# Patient Record
Sex: Male | Born: 1975 | Race: White | Hispanic: No | Marital: Single | Smoking: Never smoker
Health system: Southern US, Community
[De-identification: ages and names within clinical notes are randomized; demographics above are authoritative.]

## PROBLEM LIST (undated history)

## (undated) DIAGNOSIS — H269 Unspecified cataract: Secondary | ICD-10-CM

## (undated) DIAGNOSIS — I10 Essential (primary) hypertension: Secondary | ICD-10-CM

## (undated) DIAGNOSIS — E785 Hyperlipidemia, unspecified: Secondary | ICD-10-CM

## (undated) DIAGNOSIS — E119 Type 2 diabetes mellitus without complications: Secondary | ICD-10-CM

## (undated) HISTORY — DX: Hyperlipidemia, unspecified: E78.5

## (undated) HISTORY — DX: Type 2 diabetes mellitus without complications: E11.9

## (undated) HISTORY — DX: Essential (primary) hypertension: I10

## (undated) HISTORY — PX: ANTERIOR CRUCIATE LIGAMENT REPAIR: SHX115

## (undated) HISTORY — DX: Unspecified cataract: H26.9

## (undated) HISTORY — PX: TONSILLECTOMY: SUR1361

## (undated) HISTORY — PX: KIDNEY STONE SURGERY: SHX686

---

## 2019-12-08 ENCOUNTER — Other Ambulatory Visit: Payer: Self-pay

## 2019-12-09 ENCOUNTER — Encounter: Payer: Self-pay | Admitting: Family Medicine

## 2019-12-09 ENCOUNTER — Ambulatory Visit (INDEPENDENT_AMBULATORY_CARE_PROVIDER_SITE_OTHER): Payer: Self-pay | Admitting: Family Medicine

## 2019-12-09 VITALS — BP 132/80 | HR 78 | Temp 96.1°F | Resp 12 | Ht 66.0 in | Wt 228.0 lb

## 2019-12-09 DIAGNOSIS — I10 Essential (primary) hypertension: Secondary | ICD-10-CM | POA: Insufficient documentation

## 2019-12-09 DIAGNOSIS — Z6836 Body mass index (BMI) 36.0-36.9, adult: Secondary | ICD-10-CM

## 2019-12-09 DIAGNOSIS — E785 Hyperlipidemia, unspecified: Secondary | ICD-10-CM

## 2019-12-09 DIAGNOSIS — E1165 Type 2 diabetes mellitus with hyperglycemia: Secondary | ICD-10-CM | POA: Insufficient documentation

## 2019-12-09 DIAGNOSIS — E66812 Obesity, class 2: Secondary | ICD-10-CM | POA: Insufficient documentation

## 2019-12-09 DIAGNOSIS — E669 Obesity, unspecified: Secondary | ICD-10-CM | POA: Insufficient documentation

## 2019-12-09 DIAGNOSIS — Z794 Long term (current) use of insulin: Secondary | ICD-10-CM

## 2019-12-09 DIAGNOSIS — E1169 Type 2 diabetes mellitus with other specified complication: Secondary | ICD-10-CM

## 2019-12-09 MED ORDER — ROSUVASTATIN CALCIUM 10 MG PO TABS: 10.0000 mg | ORAL_TABLET | Freq: Every day | ORAL | 1 refills | Status: DC

## 2019-12-09 MED ORDER — EMPAGLIFLOZIN-METFORMIN HCL 5-1000 MG PO TABS: 1.0000 | ORAL_TABLET | Freq: Two times a day (BID) | ORAL | 2 refills | Status: DC

## 2019-12-09 MED ORDER — VALSARTAN 80 MG PO TABS: 80.0000 mg | ORAL_TABLET | Freq: Every day | ORAL | 1 refills | Status: DC

## 2019-12-09 MED ORDER — INSULIN SYRINGE-NEEDLE U-100 30G 1 ML MISC: 3 refills | Status: DC

## 2019-12-09 MED ORDER — INSULIN GLARGINE 100 UNIT/ML ~~LOC~~ SOLN: 15.0000 [IU] | Freq: Every day | SUBCUTANEOUS | 3 refills | Status: DC

## 2019-12-09 NOTE — Assessment & Plan Note (Addendum)
HgA1C is far from goal. Metformin dose increased from 500 mg to 2000 mg and Jardiance 10 mg added. Lantus increased from 10 units to 15 units. Diabetes education will be arranged. We discussed possible complications of diabetes. Strongly recommend arranging eye exam.  Regular exercise and healthy diet with avoidance of added sugar food intake is an important part of treatment and recommended. Annual eye exam, periodic dental and foot care recommended. F/U in 4 months

## 2019-12-09 NOTE — Progress Notes (Signed)
HPI:   Austin Fuller is a 44 y.o. male, who is here today to establish care.  Former PCP: He just moved from [place] to the area a month ago. Last preventive routine visit: Not sure but has blood work in 08/2019  Chronic medical problems: DM 2, hypertension, hyperlipidemia. He is not aware of renal disease and no history of CAD.  DM2 diagnosed in 2017. He has been out of Metformin for a couple weeks. + polyuria and polydipsia. He has not been checking BS regularly. Last eye exam in 2019. Does not remember her last A1c result. He has not noted numbness, tingling, or burning in feet but has had some occasional tingling-like sensation in left hand that lasts a few minutes. He is right-handed.  Hypertension: Diagnosed in 2017. Currently he is on valsartan 80 mg daily. He is not checking BP regularly. Negative for severe/frequent headache,chest pain, dyspnea, palpitation, claudication, focal weakness, or edema.  He had vision screening recently as part of pre employment exam and noted blurry vision.  Hyperlipidemia: Currently he is on Crestor, not sure about dose. He has tolerated medication well.  He acknowledges that he has not been consistent with following dietary recommendations. He is not exercising regularly.   Review of Systems  Constitutional: Negative for activity change, appetite change, fatigue and fever.  HENT: Negative for mouth sores, nosebleeds and sore throat.   Respiratory: Negative for cough and wheezing.   Gastrointestinal: Negative for abdominal pain, nausea and vomiting.  Genitourinary: Negative for decreased urine volume, dysuria and hematuria.  Musculoskeletal: Negative for gait problem and myalgias.  Skin: Negative for rash and wound.  Neurological: Negative for syncope, facial asymmetry and weakness.  Rest see pertinent positives and negatives per HPI.  No current outpatient medications on file prior to visit.   No current  facility-administered medications on file prior to visit.   Past Medical History:  Diagnosis Date  . Diabetes mellitus without complication (HCC)   . Hyperlipidemia   . Hypertension    No Known Allergies  Family History  Problem Relation Age of Onset  . Cancer Mother   . Early death Mother   . Early death Father   . Heart attack Father   . Heart attack Sister    Social History   Socioeconomic History  . Marital status: Single    Spouse name: Not on file  . Number of children: Not on file  . Years of education: Not on file  . Highest education level: Not on file  Occupational History  . Not on file  Tobacco Use  . Smoking status: Never Smoker  . Smokeless tobacco: Never Used  Substance and Sexual Activity  . Alcohol use: Not Currently  . Drug use: Never  . Sexual activity: Not Currently  Other Topics Concern  . Not on file  Social History Narrative  . Not on file   Social Determinants of Health   Financial Resource Strain:   . Difficulty of Paying Living Expenses: Not on file  Food Insecurity:   . Worried About Programme researcher, broadcasting/film/video in the Last Year: Not on file  . Ran Out of Food in the Last Year: Not on file  Transportation Needs:   . Lack of Transportation (Medical): Not on file  . Lack of Transportation (Non-Medical): Not on file  Physical Activity:   . Days of Exercise per Week: Not on file  . Minutes of Exercise per Session: Not on file  Stress:   .  Feeling of Stress : Not on file  Social Connections:   . Frequency of Communication with Friends and Family: Not on file  . Frequency of Social Gatherings with Friends and Family: Not on file  . Attends Religious Services: Not on file  . Active Member of Clubs or Organizations: Not on file  . Attends Banker Meetings: Not on file  . Marital Status: Not on file    Vitals:   12/09/19 1106  BP: 132/80  Pulse: 78  Resp: 12  Temp: (!) 96.1 F (35.6 C)  SpO2: 97%   Body mass index is 36.8  kg/m.  Physical Exam  Nursing note and vitals reviewed. Constitutional: He is oriented to person, place, and time. He appears well-developed. No distress.  HENT:  Head: Normocephalic and atraumatic.  Mouth/Throat: Oropharynx is clear and moist and mucous membranes are normal.  Eyes: Pupils are equal, round, and reactive to light. Conjunctivae are normal.  Cardiovascular: Normal rate and regular rhythm.  No murmur heard. Pulses:      Dorsalis pedis pulses are 2+ on the right side and 2+ on the left side.  Respiratory: Effort normal and breath sounds normal. No respiratory distress.  GI: Soft. He exhibits no mass. There is no hepatomegaly. There is no abdominal tenderness.  Musculoskeletal:        General: No edema.  Lymphadenopathy:    He has no cervical adenopathy.  Neurological: He is alert and oriented to person, place, and time. He has normal strength. No cranial nerve deficit. Gait normal.  Skin: Skin is warm. No rash noted. No erythema.  Psychiatric: He has a normal mood and affect. Cognition and memory are normal.  Well groomed, good eye contact.   Diabetic Foot Exam - Simple   Simple Foot Form Diabetic Foot exam was performed with the following findings: Yes 12/09/2019 11:48 AM  Visual Inspection No deformities, no ulcerations, no other skin breakdown bilaterally: Yes Sensation Testing Intact to touch and monofilament testing bilaterally: Yes Pulse Check Posterior Tibialis and Dorsalis pulse intact bilaterally: Yes Comments     ASSESSMENT AND PLAN:  Austin Fuller was seen today for establish care.  Diagnoses and all orders for this visit:  Orders Placed This Encounter  Procedures  . Comprehensive metabolic panel  . Microalbumin / creatinine urine ratio  . Fructosamine  . Hemoglobin A1c  . Lipid panel  . CBC  . Amb Referral to Nutrition and Diabetic E    Hyperlipidemia associated with type 2 diabetes mellitus (HCC) He is not sure about Crestor dose, recommend  10 mg daily. We will adjust dose after lipid panel results. Low-fat diet is also recommended.  Type 2 diabetes mellitus with other specified complication (HCC) HgA1C is far from goal. Metformin dose increased from 500 mg to 2000 mg and Jardiance 10 mg added. Lantus increased from 10 units to 15 units. Diabetes education will be arranged. We discussed possible complications of diabetes. Strongly recommend arranging eye exam.  Regular exercise and healthy diet with avoidance of added sugar food intake is an important part of treatment and recommended. Annual eye exam, periodic dental and foot care recommended. F/U in 4 months   Hypertension, essential, benign BP adequately controlled. Continue valsartan 80 mg daily. Recommend monitoring BP regularly. Continue low-salt diet.  Class 2 obesity with body mass index (BMI) of 36.0 to 36.9 in adult We discussed benefits of wt loss as well as adverse effects of obesity. Consistency with healthy diet and physical activity recommended.  Daily brisk walking for 15-30 min as tolerated.   He will have health insurance after 12/15/19, so further lab orders placed.  Return in about 4 months (around 04/07/2020) for Labs on 12/16/19.Marland Kitchen   Giordano Getman G. [place], MD  [facility]. [place] office.

## 2019-12-09 NOTE — Assessment & Plan Note (Signed)
We discussed benefits of wt loss as well as adverse effects of obesity. Consistency with healthy diet and physical activity recommended. Daily brisk walking for 15-30 min as tolerated.  

## 2019-12-09 NOTE — Assessment & Plan Note (Signed)
He is not sure about Crestor dose, recommend 10 mg daily. We will adjust dose after lipid panel results. Low-fat diet is also recommended.

## 2019-12-09 NOTE — Patient Instructions (Signed)
A few things to remember from today's visit:   Hypertension, essential, benign - Plan: CBC  Type 2 diabetes mellitus with other specified complication, with long-term current use of insulin (HCC) - Plan: Comprehensive metabolic panel, Microalbumin / creatinine urine ratio, Fructosamine, Hemoglobin A1c  Hyperlipidemia associated with type 2 diabetes mellitus (HCC) - Plan: Lipid panel, rosuvastatin (CRESTOR) 10 MG tablet   Diabetes Mellitus and Nutrition, Adult When you have diabetes (diabetes mellitus), it is very important to have healthy eating habits because your blood sugar (glucose) levels are greatly affected by what you eat and drink. Eating healthy foods in the appropriate amounts, at about the same times every day, can help you:  Control your blood glucose.  Lower your risk of heart disease.  Improve your blood pressure.  Reach or maintain a healthy weight. Every person with diabetes is different, and each person has different needs for a meal plan. Your health care provider may recommend that you work with a diet and nutrition specialist (dietitian) to make a meal plan that is best for you. Your meal plan may vary depending on factors such as:  The calories you need.  The medicines you take.  Your weight.  Your blood glucose, blood pressure, and cholesterol levels.  Your activity level.  Other health conditions you have, such as heart or kidney disease. How do carbohydrates affect me? Carbohydrates, also called carbs, affect your blood glucose level more than any other type of food. Eating carbs naturally raises the amount of glucose in your blood. Carb counting is a method for keeping track of how many carbs you eat. Counting carbs is important to keep your blood glucose at a healthy level, especially if you use insulin or take certain oral diabetes medicines. It is important to know how many carbs you can safely have in each meal. This is different for every person. Your  dietitian can help you calculate how many carbs you should have at each meal and for each snack. Foods that contain carbs include:  Bread, cereal, rice, pasta, and crackers.  Potatoes and corn.  Peas, beans, and lentils.  Milk and yogurt.  Fruit and juice.  Desserts, such as cakes, cookies, ice cream, and candy. How does alcohol affect me? Alcohol can cause a sudden decrease in blood glucose (hypoglycemia), especially if you use insulin or take certain oral diabetes medicines. Hypoglycemia can be a life-threatening condition. Symptoms of hypoglycemia (sleepiness, dizziness, and confusion) are similar to symptoms of having too much alcohol. If your health care provider says that alcohol is safe for you, follow these guidelines:  Limit alcohol intake to no more than 1 drink per day for nonpregnant women and 2 drinks per day for men. One drink equals 12 oz of beer, 5 oz of wine, or 1 oz of hard liquor.  Do not drink on an empty stomach.  Keep yourself hydrated with water, diet soda, or unsweetened iced tea.  Keep in mind that regular soda, juice, and other mixers may contain a lot of sugar and must be counted as carbs. What are tips for following this plan?  Reading food labels  Start by checking the serving size on the "Nutrition Facts" label of packaged foods and drinks. The amount of calories, carbs, fats, and other nutrients listed on the label is based on one serving of the item. Many items contain more than one serving per package.  Check the total grams (g) of carbs in one serving. You can calculate the number  of servings of carbs in one serving by dividing the total carbs by 15. For example, if a food has 30 g of total carbs, it would be equal to 2 servings of carbs.  Check the number of grams (g) of saturated and trans fats in one serving. Choose foods that have low or no amount of these fats.  Check the number of milligrams (mg) of salt (sodium) in one serving. Most people  should limit total sodium intake to less than 2,300 mg per day.  Always check the nutrition information of foods labeled as "low-fat" or "nonfat". These foods may be higher in added sugar or refined carbs and should be avoided.  Talk to your dietitian to identify your daily goals for nutrients listed on the label. Shopping  Avoid buying canned, premade, or processed foods. These foods tend to be high in fat, sodium, and added sugar.  Shop around the outside edge of the grocery store. This includes fresh fruits and vegetables, bulk grains, fresh meats, and fresh dairy. Cooking  Use low-heat cooking methods, such as baking, instead of high-heat cooking methods like deep frying.  Cook using healthy oils, such as olive, canola, or sunflower oil.  Avoid cooking with butter, cream, or high-fat meats. Meal planning  Eat meals and snacks regularly, preferably at the same times every day. Avoid going long periods of time without eating.  Eat foods high in fiber, such as fresh fruits, vegetables, beans, and whole grains. Talk to your dietitian about how many servings of carbs you can eat at each meal.  Eat 4-6 ounces (oz) of lean protein each day, such as lean meat, chicken, fish, eggs, or tofu. One oz of lean protein is equal to: ? 1 oz of meat, chicken, or fish. ? 1 egg. ?  cup of tofu.  Eat some foods each day that contain healthy fats, such as avocado, nuts, seeds, and fish. Lifestyle  Check your blood glucose regularly.  Exercise regularly as told by your health care provider. This may include: ? 150 minutes of moderate-intensity or vigorous-intensity exercise each week. This could be brisk walking, biking, or water aerobics. ? Stretching and doing strength exercises, such as yoga or weightlifting, at least 2 times a week.  Take medicines as told by your health care provider.  Do not use any products that contain nicotine or tobacco, such as cigarettes and e-cigarettes. If you need  help quitting, ask your health care provider.  Work with a Social worker or diabetes educator to identify strategies to manage stress and any emotional and social challenges. Questions to ask a health care provider  Do I need to meet with a diabetes educator?  Do I need to meet with a dietitian?  What number can I call if I have questions?  When are the best times to check my blood glucose? Where to find more information:  American Diabetes Association: diabetes.org  Academy of Nutrition and Dietetics: www.eatright.[employer] of Diabetes and Digestive and Kidney Diseases (NIH): [employer] Summary  A healthy meal plan will help you control your blood glucose and maintain a healthy lifestyle.  Working with a diet and nutrition specialist (dietitian) can help you make a meal plan that is best for you.  Keep in mind that carbohydrates (carbs) and alcohol have immediate effects on your blood glucose levels. It is important to count carbs and to use alcohol carefully. This information is not intended to replace advice given to you by your health care provider.  Make sure you discuss any questions you have with your health care provider. Document Revised: 09/14/2017 Document Reviewed: 11/06/2016 Elsevier Patient Education  2020 [employer]. Please be sure medication list is accurate. If a new problem present, please set up appointment sooner than planned today.

## 2019-12-09 NOTE — Assessment & Plan Note (Signed)
BP adequately controlled. Continue valsartan 80 mg daily. Recommend monitoring BP regularly. Continue low-salt diet.

## 2019-12-19 ENCOUNTER — Other Ambulatory Visit: Payer: Self-pay

## 2019-12-22 ENCOUNTER — Other Ambulatory Visit (INDEPENDENT_AMBULATORY_CARE_PROVIDER_SITE_OTHER): Payer: 59

## 2019-12-22 ENCOUNTER — Ambulatory Visit: Payer: Self-pay

## 2019-12-22 ENCOUNTER — Other Ambulatory Visit: Payer: Self-pay

## 2019-12-22 DIAGNOSIS — I10 Essential (primary) hypertension: Secondary | ICD-10-CM

## 2019-12-22 DIAGNOSIS — E1169 Type 2 diabetes mellitus with other specified complication: Secondary | ICD-10-CM | POA: Diagnosis not present

## 2019-12-22 DIAGNOSIS — Z794 Long term (current) use of insulin: Secondary | ICD-10-CM

## 2019-12-22 DIAGNOSIS — E785 Hyperlipidemia, unspecified: Secondary | ICD-10-CM

## 2019-12-22 LAB — COMPREHENSIVE METABOLIC PANEL
ALT: 14 U/L (ref 0–53)
AST: 10 U/L (ref 0–37)
Albumin: 4.3 g/dL (ref 3.5–5.2)
Alkaline Phosphatase: 146 U/L — ABNORMAL HIGH (ref 39–117)
BUN: 11 mg/dL (ref 6–23)
CO2: 32 mEq/L (ref 19–32)
Calcium: 10 mg/dL (ref 8.4–10.5)
Chloride: 100 mEq/L (ref 96–112)
Creatinine, Ser: 1.02 mg/dL (ref 0.40–1.50)
GFR: 79.45 mL/min (ref >60.00)
Glucose, Bld: 460 mg/dL — ABNORMAL HIGH (ref 70–99)
Potassium: 4.3 mEq/L (ref 3.5–5.1)
Sodium: 138 mEq/L (ref 135–145)
Total Bilirubin: 0.6 mg/dL (ref 0.2–1.2)
Total Protein: 6.6 g/dL (ref 6.0–8.3)

## 2019-12-22 LAB — LIPID PANEL
Cholesterol: 150 mg/dL (ref 0–200)
HDL: 36.3 mg/dL — ABNORMAL LOW (ref >39.00)
NonHDL: 113.85
Total CHOL/HDL Ratio: 4
Triglycerides: 242 mg/dL — ABNORMAL HIGH (ref 0.0–149.0)
VLDL: 48.4 mg/dL — ABNORMAL HIGH (ref 0.0–40.0)

## 2019-12-22 LAB — MICROALBUMIN / CREATININE URINE RATIO
Creatinine,U: 49.5 mg/dL
Microalb Creat Ratio: 1.7 mg/g (ref 0.0–30.0)
Microalb, Ur: 0.8 mg/dL (ref 0.0–1.9)

## 2019-12-22 LAB — CBC
HCT: 46.9 % (ref 39.0–52.0)
Hemoglobin: 16.1 g/dL (ref 13.0–17.0)
MCHC: 34.3 g/dL (ref 30.0–36.0)
MCV: 95 fl (ref 78.0–100.0)
Platelets: 217 10*3/uL (ref 150.0–400.0)
RBC: 4.93 Mil/uL (ref 4.22–5.81)
RDW: 12 % (ref 11.5–15.5)
WBC: 5.5 10*3/uL (ref 4.0–10.5)

## 2019-12-22 LAB — LDL CHOLESTEROL, DIRECT: Direct LDL: 78 mg/dL

## 2019-12-22 LAB — HEMOGLOBIN A1C: Hgb A1c MFr Bld: 15.9 % — ABNORMAL HIGH (ref 4.6–6.5)

## 2019-12-25 LAB — FRUCTOSAMINE: Fructosamine: >600 umol/L — ABNORMAL HIGH (ref 205–285)

## 2020-01-06 ENCOUNTER — Encounter: Payer: 59 | Attending: Family Medicine | Admitting: Registered"

## 2020-01-19 ENCOUNTER — Telehealth: Payer: Self-pay | Admitting: Family Medicine

## 2020-01-19 NOTE — Telephone Encounter (Signed)
Pt is requesting a refill on Valsartan 80 mg tablet and Metformin HCI 02-999 mg tabs sent to [facility] on [address]. Also, pt would like a call back to discuss his lab results from a month ago. Thanks

## 2020-01-20 ENCOUNTER — Other Ambulatory Visit: Payer: Self-pay | Admitting: *Deleted

## 2020-01-20 DIAGNOSIS — I10 Essential (primary) hypertension: Secondary | ICD-10-CM

## 2020-01-20 DIAGNOSIS — E1169 Type 2 diabetes mellitus with other specified complication: Secondary | ICD-10-CM

## 2020-01-20 DIAGNOSIS — Z794 Long term (current) use of insulin: Secondary | ICD-10-CM

## 2020-01-20 MED ORDER — VALSARTAN 80 MG PO TABS: 80.0000 mg | ORAL_TABLET | Freq: Every day | ORAL | 1 refills | Status: DC

## 2020-01-20 MED ORDER — EMPAGLIFLOZIN-METFORMIN HCL 5-1000 MG PO TABS: 1.0000 | ORAL_TABLET | Freq: Two times a day (BID) | ORAL | 2 refills | Status: DC

## 2020-01-20 NOTE — Telephone Encounter (Signed)
Please advise on lab results. Note from 12/30/19 states Treatment was adjusted last visit. Rx's sent to pharmacy as requested.

## 2020-01-23 NOTE — Telephone Encounter (Signed)
Diabetes numbers are not at goal. Cholesterol mildly elevated.  Because last visit Crestor 10 mg was resumed and diabetes treatment was adjusted, I did not make new recommendations at this time. If BS's 200's, we will need to adjust Lantus dose, increase by 5 U.  Nutrition referral can also be arranged [if he has not done so before and is interested in doing so.] Labs need to be repeated on 04/07/2020, f/u appt. Thanks, BJ

## 2020-01-23 NOTE — Telephone Encounter (Signed)
Note read to patient. Patient verbalized understanding. Patient stated that he has an upcoming appointment with the nutritionist.

## 2020-02-09 ENCOUNTER — Encounter: Payer: 59 | Attending: Family Medicine | Admitting: Dietician

## 2020-02-09 ENCOUNTER — Encounter: Payer: Self-pay | Admitting: Dietician

## 2020-02-09 ENCOUNTER — Other Ambulatory Visit: Payer: Self-pay

## 2020-02-09 DIAGNOSIS — E1169 Type 2 diabetes mellitus with other specified complication: Secondary | ICD-10-CM | POA: Diagnosis not present

## 2020-02-09 DIAGNOSIS — Z794 Long term (current) use of insulin: Secondary | ICD-10-CM | POA: Insufficient documentation

## 2020-02-09 DIAGNOSIS — Z713 Dietary counseling and surveillance: Secondary | ICD-10-CM | POA: Insufficient documentation

## 2020-02-09 NOTE — Patient Instructions (Addendum)
Consider the [employer] 14 day.  You do not need the reader as you can use an app on your cell phone.  Ask your doctor to prescribe this if desired.  Calorie [employer] Consider reading labels and becoming informed about your meal choices especially when eating out.   Aim for 4 Carb Choices per meal (60 grams) +/- 1 either way  Aim for 0-2 Carbs per snack if hungry  Include protein in moderation with your meals and snacks Consider reading food labels for Total Carbohydrate of foods Consider  increasing your activity level by walking for 30 minutes daily as tolerated Consider checking BG at alternate times per day  Consider taking medication as directed by MD

## 2020-02-09 NOTE — Progress Notes (Signed)
Diabetes Self-Management Education  Visit Type: First/Initial  Appt. Start Time: 1910 Appt. End Time: 1020  02/09/2020  Mr. Austin Fuller, identified by name and date of birth, is a 44 y.o. male with a diagnosis of Diabetes: Type 2.   ASSESSMENT Patient is here today alone.  History includes Type 2 diabetes diagnosed in 2017, HTN, and HLD. Last A1C 15.9% 12/22/2019.  Other labs include Cholesterol 156, HDL 36, LDL 78, Triglycerides 242, GFR 79 12/2019 Medications include:  Metformin, Lantus 15 units at night (Jardiance added but he is not taking this). Sleep:  6-8 hours, snores He is not currently checking his blood sugar.  Discussed the importance of this for medical management as well as safety.  He is receptive to the [employer].    Weight hx: 239 lbs 02/09/2020, lowest adult weight 228 lbs 12/09/2019 300 lbs 1 year ago.  Lost due to uncontrolled DM.  Patient lives alone.  Eats out or TV dinners.  Does not know how to cook.  Verbalized the need to eat healthier.  Inconsistent meal schedule due to shift work.  Patient's MD in [place] suggested the keto diet.  Discussed the risks and rapid weight regain with this way of eating.  He lost his father last year and is the executor for his estate.  He moved her from [place] in January 2021. He works nights in Human resources officer.  He is currently living in an extended stay hotel with a kitchenette and is currently looking to buy a home.   Height 5\' 7"  (1.702 m), weight 239 lb (108.4 kg). Body mass index is 37.43 kg/m.  Diabetes Self-Management Education - 02/09/20 0923      Visit Information   Visit Type  First/Initial      Initial Visit   Diabetes Type  Type 2    Are you taking your medications as prescribed?  Yes    Date Diagnosed  2017      Health Coping   How would you rate your overall health?  Good      Psychosocial Assessment   Patient Belief/Attitude about Diabetes  Motivated to manage diabetes    Self-care barriers   None    Self-management support  Doctor's office    Patient Concerns  Nutrition/Meal planning;Glycemic Control;Weight Control    Special Needs  None    Preferred Learning Style  No preference indicated    Learning Readiness  Ready    How often do you need to have someone help you when you read instructions, pamphlets, or other written materials from your doctor or pharmacy?  1 - Never    What is the last grade level you completed in school?  2 years college      Pre-Education Assessment   Patient understands the diabetes disease and treatment process.  Needs Instruction    Patient understands incorporating nutritional management into lifestyle.  Needs Instruction    Patient undertands incorporating physical activity into lifestyle.  Needs Instruction    Patient understands using medications safely.  Needs Instruction    Patient understands monitoring blood glucose, interpreting and using results  Needs Instruction    Patient understands prevention, detection, and treatment of acute complications.  Needs Instruction    Patient understands prevention, detection, and treatment of chronic complications.  Needs Instruction    Patient understands how to develop strategies to address psychosocial issues.  Needs Instruction    Patient understands how to develop strategies to promote health/change behavior.  Needs Instruction  Complications   Last HgB A1C per patient/outside source  15.9 %   12/2019   How often do you check your blood sugar?  0 times/day (not testing)    Have you had a dilated eye exam in the past 12 months?  No    Have you had a dental exam in the past 12 months?  No    Are you checking your feet?  No      Dietary Intake   Breakfast  cereal or donuts or breakfast platter at [employer]   6 am   Lunch  Fast food (sheets)    Dinner  [employer] ([employer], etc.)    Snack (evening)  candy bar    Beverage(s)  water, sugar free soda, sweet tea      Exercise   Exercise Type  Light  (walking / raking leaves)   just work     Patient Education   Previous Diabetes Education  No    Disease state   Definition of diabetes, type 1 and 2, and the diagnosis of diabetes    Nutrition management   Role of diet in the treatment of diabetes and the relationship between the three main macronutrients and blood glucose level;Food label reading, portion sizes and measuring food.;Carbohydrate counting;Meal options for control of blood glucose level and chronic complications.;Information on hints to eating out and maintain blood glucose control.    Physical activity and exercise   Role of exercise on diabetes management, blood pressure control and cardiac health.    Medications  Taught/reviewed insulin injection, site rotation, insulin storage and needle disposal.;Reviewed patients medication for diabetes, action, purpose, timing of dose and side effects.    Monitoring  Purpose and frequency of SMBG.;Taught/discussed recording of test results and interpretation of SMBG.;Identified appropriate SMBG and/or A1C goals.;Daily foot exams;Yearly dilated eye exam    Acute complications  Taught treatment of hypoglycemia - the 15 rule.;Discussed and identified patients' treatment of hyperglycemia.    Chronic complications  Relationship between chronic complications and blood glucose control;Dental care;Retinopathy and reason for yearly dilated eye exams    Psychosocial adjustment  Worked with patient to identify barriers to care and solutions;Identified and addressed patients feelings and concerns about diabetes      Individualized Goals (developed by patient)   Nutrition  General guidelines for healthy choices and portions discussed;Follow meal plan discussed    Physical Activity  Exercise 5-7 days per week;30 minutes per day    Medications  take my medication as prescribed    Monitoring   test my blood glucose as discussed    Reducing Risk  increase portions of healthy fats    Health Coping  discuss  diabetes with (comment)   MD, RD, CDCES     Post-Education Assessment   Patient understands the diabetes disease and treatment process.  Demonstrates understanding / competency    Patient understands incorporating nutritional management into lifestyle.  Needs Review    Patient undertands incorporating physical activity into lifestyle.  Demonstrates understanding / competency    Patient understands using medications safely.  Demonstrates understanding / competency    Patient understands monitoring blood glucose, interpreting and using results  Demonstrates understanding / competency    Patient understands prevention, detection, and treatment of acute complications.  Demonstrates understanding / competency    Patient understands prevention, detection, and treatment of chronic complications.  Demonstrates understanding / competency    Patient understands how to develop strategies to address psychosocial issues.  Demonstrates understanding / competency  Patient understands how to develop strategies to promote health/change behavior.  Needs Review      Outcomes   Expected Outcomes  Demonstrated interest in learning. Expect positive outcomes    Future DMSE  4-6 wks    Program Status  Not Completed       Individualized Plan for Diabetes Self-Management Training:   Learning Objective:  Patient will have a greater understanding of diabetes self-management. Patient education plan is to attend individual and/or group sessions per assessed needs and concerns.   Plan:   Patient Instructions  Consider the FreeStyle Libre 14 day.  You do not need the reader as you can use an app on your cell phone.  Ask your doctor to prescribe this if desired.  Calorie [employer] Consider reading labels and becoming informed about your meal choices especially when eating out.   Aim for 4 Carb Choices per meal (60 grams) +/- 1 either way  Aim for 0-2 Carbs per snack if hungry  Include protein in moderation with  your meals and snacks Consider reading food labels for Total Carbohydrate of foods Consider  increasing your activity level by walking for 30 minutes daily as tolerated Consider checking BG at alternate times per day  Consider taking medication as directed by MD       Expected Outcomes:  Demonstrated interest in learning. Expect positive outcomes  Education material provided: ADA - How to Thrive: A Guide for Your Journey with Diabetes, Food label handouts, Meal plan card and Snack sheet  If problems or questions, patient to contact team via:  Phone, [facility]  Future DSME appointment: 4-6 wks

## 2020-02-13 ENCOUNTER — Other Ambulatory Visit: Payer: Self-pay | Admitting: *Deleted

## 2020-02-13 ENCOUNTER — Telehealth: Payer: Self-pay | Admitting: Family Medicine

## 2020-02-13 DIAGNOSIS — Z794 Long term (current) use of insulin: Secondary | ICD-10-CM

## 2020-02-13 DIAGNOSIS — E1169 Type 2 diabetes mellitus with other specified complication: Secondary | ICD-10-CM

## 2020-02-13 NOTE — Telephone Encounter (Signed)
Message Routed to PCP for review and approval. 

## 2020-02-13 NOTE — Telephone Encounter (Signed)
Pt states he saw a dietician and she recommends he get a referral from his PCP for an Endocrinologist due to his A1C being high. She also recommended that a Free style libre be sent in for the pt as well.   Pharmacy: [facility] [address] [place] FAX: [phone]   Pt can be reached at [phone]

## 2020-02-13 NOTE — Telephone Encounter (Signed)
Referral placed as requested. Please advise on the Free style [place]

## 2020-02-13 NOTE — Telephone Encounter (Signed)
Referral to endocrinologist can be placed as requested. Thanks, BJ

## 2020-02-13 NOTE — Telephone Encounter (Signed)
That is fine. °Thanks, °BJ °

## 2020-02-16 ENCOUNTER — Other Ambulatory Visit: Payer: Self-pay | Admitting: *Deleted

## 2020-02-16 DIAGNOSIS — Z794 Long term (current) use of insulin: Secondary | ICD-10-CM

## 2020-02-16 DIAGNOSIS — E1169 Type 2 diabetes mellitus with other specified complication: Secondary | ICD-10-CM

## 2020-02-16 MED ORDER — FREESTYLE LIBRE 14 DAY SENSOR MISC: 0 refills | Status: DC

## 2020-02-16 MED ORDER — FREESTYLE LIBRE 14 DAY READER DEVI: 0 refills | Status: DC

## 2020-02-16 NOTE — Telephone Encounter (Signed)
Rx sent to the pharmacy for freestyle [place] as requested.

## 2020-03-01 ENCOUNTER — Other Ambulatory Visit: Payer: Self-pay | Admitting: Family Medicine

## 2020-03-01 ENCOUNTER — Ambulatory Visit: Payer: 59 | Admitting: Dietician

## 2020-03-03 ENCOUNTER — Other Ambulatory Visit: Payer: Self-pay | Admitting: Family Medicine

## 2020-03-03 DIAGNOSIS — Z794 Long term (current) use of insulin: Secondary | ICD-10-CM

## 2020-03-29 ENCOUNTER — Other Ambulatory Visit: Payer: Self-pay | Admitting: Family Medicine

## 2020-03-29 DIAGNOSIS — Z794 Long term (current) use of insulin: Secondary | ICD-10-CM

## 2020-04-05 ENCOUNTER — Ambulatory Visit (INDEPENDENT_AMBULATORY_CARE_PROVIDER_SITE_OTHER): Payer: 59 | Admitting: Family Medicine

## 2020-04-05 ENCOUNTER — Other Ambulatory Visit: Payer: Self-pay

## 2020-04-05 ENCOUNTER — Encounter: Payer: Self-pay | Admitting: Family Medicine

## 2020-04-05 VITALS — BP 120/82 | HR 78 | Temp 97.6°F | Resp 12 | Ht 67.0 in | Wt 242.5 lb

## 2020-04-05 DIAGNOSIS — Z794 Long term (current) use of insulin: Secondary | ICD-10-CM

## 2020-04-05 DIAGNOSIS — I10 Essential (primary) hypertension: Secondary | ICD-10-CM

## 2020-04-05 DIAGNOSIS — E785 Hyperlipidemia, unspecified: Secondary | ICD-10-CM

## 2020-04-05 DIAGNOSIS — E1169 Type 2 diabetes mellitus with other specified complication: Secondary | ICD-10-CM

## 2020-04-05 DIAGNOSIS — Z6836 Body mass index (BMI) 36.0-36.9, adult: Secondary | ICD-10-CM

## 2020-04-05 LAB — POCT GLYCOSYLATED HEMOGLOBIN (HGB A1C): Hemoglobin A1C: 12.8 % — AB (ref 4.0–5.6)

## 2020-04-05 MED ORDER — "BD INSULIN SYRINGE U/F 30G X 1/2"" 1 ML MISC": 1.0000 | Freq: Every day | 3 refills | Status: AC

## 2020-04-05 MED ORDER — INSULIN GLARGINE 100 UNIT/ML ~~LOC~~ SOLN: 25.0000 [IU] | Freq: Every day | SUBCUTANEOUS | 3 refills | Status: DC

## 2020-04-05 MED ORDER — ROSUVASTATIN CALCIUM 10 MG PO TABS: 10.0000 mg | ORAL_TABLET | Freq: Every day | ORAL | 2 refills | Status: DC

## 2020-04-05 MED ORDER — EMPAGLIFLOZIN-METFORMIN HCL 5-1000 MG PO TABS: 1.0000 | ORAL_TABLET | Freq: Two times a day (BID) | ORAL | 3 refills | Status: DC

## 2020-04-05 NOTE — Assessment & Plan Note (Signed)
HgA1C is not at goal.  Regular exercise and healthy diet with avoidance of added sugar food intake is an important part of treatment and recommended. Annual eye exam, periodic dental and foot care recommended. F/U in 5-6 months

## 2020-04-05 NOTE — Patient Instructions (Addendum)
A few things to remember from today's visit:   Type 2 diabetes mellitus with other specified complication, with long-term current use of insulin (HCC) - Plan: POC HgB A1c, Empagliflozin-metFORMIN HCl 02-999 MG TABS, insulin glargine (LANTUS) 100 UNIT/ML injection  Hypertension, essential, benign  Hyperlipidemia associated with type 2 diabetes mellitus (HCC) - Plan: rosuvastatin (CRESTOR) 10 MG tablet  Lantus increased from 15-20 U to 25 U daily. Empagliflozin added today and it is in the same pill with metformin.  You need an eye exam. If blood sugars still 200 ,increase Lantus by 5 U.  If you need refills please call your pharmacy. Do not use My Chart to request refills or for acute issues that need immediate attention.    Please be sure medication list is accurate. If a new problem present, please set up appointment sooner than planned today.

## 2020-04-05 NOTE — Assessment & Plan Note (Signed)
Gained some wt since his last visit. We discussed benefits of wt loss as well as adverse effects of obesity. Consistency with healthy diet and physical activity recommended.

## 2020-04-05 NOTE — Assessment & Plan Note (Signed)
BP adequately controlled. No changes in current management. Low salt diet recommended. Due for eye exam.

## 2020-04-05 NOTE — Progress Notes (Signed)
HPI: Austin Fuller is a 44 y.o. male, who is here today for 4 months follow up.   He was last seen on 12/09/19.  DM2: Diagnosed in 2017. Negative for polydipsia,polyuria, or polyphagia. BS's still elevated, 300-500's.  Lab Results  Component Value Date   HGBA1C 15.9 (H) 12/22/2019   Lab Results  Component Value Date   MICROALBUR 0.8 12/22/2019   Hypertension: Diagnosed in 2017. He is on Valsartan 80 mg daily. Negative for severe/frequent headache, visual changes, chest pain, dyspnea, palpitation, claudication, focal weakness, or edema.  Needs refills on Crestor 10 mg. Lab Results  Component Value Date   CHOL 150 12/22/2019   HDL 36.30 (L) 12/22/2019   LDLDIRECT 78.0 12/22/2019   TRIG 242.0 (H) 12/22/2019   CHOLHDL 4 12/22/2019   He has gained some wt sine his last visit. He has not follow dietary recommendations nor exercising regularly.  Review of Systems  Constitutional: Negative for activity change, appetite change, fatigue and fever.  HENT: Negative for mouth sores, nosebleeds and sore throat.   Respiratory: Negative for cough and wheezing.   Gastrointestinal: Negative for abdominal pain, nausea and vomiting.  Genitourinary: Negative for decreased urine volume and hematuria.  Musculoskeletal: Negative for gait problem and myalgias.  Neurological: Negative for syncope, facial asymmetry and weakness.  Rest of ROS, see pertinent positives sand negatives in HPI  Current Outpatient Medications on File Prior to Visit  Medication Sig Dispense Refill  . cholecalciferol (VITAMIN D3) 25 MCG (1000 UNIT) tablet Take 4,000 Units by mouth daily.    . Continuous Blood Gluc Receiver (FREESTYLE LIBRE 14 DAY READER) DEVI Use as directed to test blood sugar 1 each 0  . Continuous Blood Gluc Sensor (FREESTYLE LIBRE 14 DAY SENSOR) MISC USE TO TEST BLOOD SUGAR AS DIRECTED 1 each 0  . valsartan (DIOVAN) 80 MG tablet Take 1 tablet (80 mg total) by mouth daily. 90  tablet 1   No current facility-administered medications on file prior to visit.   Past Medical History:  Diagnosis Date  . Diabetes mellitus without complication (HCC)   . Hyperlipidemia   . Hypertension    No Known Allergies  Social History   Socioeconomic History  . Marital status: Single    Spouse name: Not on file  . Number of children: Not on file  . Years of education: Not on file  . Highest education level: Not on file  Occupational History  . Not on file  Tobacco Use  . Smoking status: Never Smoker  . Smokeless tobacco: Never Used  Substance and Sexual Activity  . Alcohol use: Not Currently  . Drug use: Never  . Sexual activity: Not Currently  Other Topics Concern  . Not on file  Social History Narrative  . Not on file   Social Determinants of Health   Financial Resource Strain:   . Difficulty of Paying Living Expenses:   Food Insecurity:   . Worried About Programme researcher, broadcasting/film/video in the Last Year:   . Barista in the Last Year:   Transportation Needs:   . Freight forwarder (Medical):   Marland Kitchen Lack of Transportation (Non-Medical):   Physical Activity:   . Days of Exercise per Week:   . Minutes of Exercise per Session:   Stress:   . Feeling of Stress :   Social Connections:   . Frequency of Communication with Friends and Family:   . Frequency of Social Gatherings with Friends and  Family:   . Attends Religious Services:   . Active Member of Clubs or Organizations:   . Attends Archivist Meetings:   Marland Kitchen Marital Status:    Vitals:   04/05/20 0955  BP: 120/82  Pulse: 78  Resp: 12  Temp: 97.6 F (36.4 C)  SpO2: 93%    Wt Readings from Last 3 Encounters:  04/05/20 242 lb 8 oz (110 kg)  02/09/20 239 lb (108.4 kg)  12/09/19 228 lb (103.4 kg)   Body mass index is 37.98 kg/m.  Physical Exam  Nursing note and vitals reviewed. Constitutional: He is oriented to person, place, and time. He appears well-developed. No distress.  HENT:    Head: Normocephalic and atraumatic.  Mouth/Throat: Mucous membranes are moist.  Eyes: Pupils are equal, round, and reactive to light. Conjunctivae are normal.  Cardiovascular: Normal rate and regular rhythm.  No murmur heard. Pulses:      Dorsalis pedis pulses are 2+ on the right side and 2+ on the left side.  Respiratory: Effort normal and breath sounds normal. No respiratory distress.  GI: Soft. Normal appearance. He exhibits no mass. There is no hepatomegaly. There is no abdominal tenderness.  Lymphadenopathy:    He has no cervical adenopathy.  Neurological: He is alert and oriented to person, place, and time. No cranial nerve deficit. Gait normal.  Skin: Skin is warm. No rash noted. No erythema.  Psychiatric:  Well groomed, good eye contact.   ASSESSMENT AND PLAN:   Austin Fuller was seen today for 4 months follow-up.  Orders Placed This Encounter  Procedures  . POC HgB A1c    Type 2 diabetes mellitus with other specified complication ([place]) INO6V is not at goal.  Regular exercise and healthy diet with avoidance of added sugar food intake is an important part of treatment and recommended. Annual eye exam, periodic dental and foot care recommended. F/U in 5-6 months   Class 2 obesity with body mass index (BMI) of 36.0 to 36.9 in adult Gained some wt since his last visit. We discussed benefits of wt loss as well as adverse effects of obesity. Consistency with healthy diet and physical activity recommended.   Hypertension, essential, benign BP adequately controlled. No changes in current management. Low salt diet recommended. Due for eye exam.   Return in about 4 months (around 08/05/2020).  [name] G. [place], MD  [facility]. [place] office.

## 2020-04-08 ENCOUNTER — Encounter: Payer: Self-pay | Admitting: Family Medicine

## 2020-04-16 ENCOUNTER — Other Ambulatory Visit: Payer: Self-pay | Admitting: Family Medicine

## 2020-04-16 DIAGNOSIS — Z794 Long term (current) use of insulin: Secondary | ICD-10-CM

## 2020-05-07 ENCOUNTER — Other Ambulatory Visit: Payer: Self-pay | Admitting: Family Medicine

## 2020-05-07 DIAGNOSIS — Z794 Long term (current) use of insulin: Secondary | ICD-10-CM

## 2020-05-07 DIAGNOSIS — E1169 Type 2 diabetes mellitus with other specified complication: Secondary | ICD-10-CM

## 2020-05-29 ENCOUNTER — Other Ambulatory Visit: Payer: Self-pay | Admitting: Family Medicine

## 2020-06-14 ENCOUNTER — Other Ambulatory Visit: Payer: Self-pay | Admitting: Family Medicine

## 2020-06-14 DIAGNOSIS — Z794 Long term (current) use of insulin: Secondary | ICD-10-CM

## 2020-07-29 ENCOUNTER — Other Ambulatory Visit: Payer: Self-pay | Admitting: Family Medicine

## 2020-07-29 DIAGNOSIS — I10 Essential (primary) hypertension: Secondary | ICD-10-CM

## 2020-08-06 ENCOUNTER — Ambulatory Visit: Payer: 59 | Admitting: Family Medicine

## 2020-08-10 ENCOUNTER — Other Ambulatory Visit: Payer: Self-pay

## 2020-08-10 ENCOUNTER — Ambulatory Visit (INDEPENDENT_AMBULATORY_CARE_PROVIDER_SITE_OTHER): Payer: 59 | Admitting: Family Medicine

## 2020-08-10 ENCOUNTER — Encounter: Payer: Self-pay | Admitting: Family Medicine

## 2020-08-10 VITALS — BP 120/70 | HR 87 | Resp 16 | Ht 67.0 in | Wt 249.0 lb

## 2020-08-10 DIAGNOSIS — Z794 Long term (current) use of insulin: Secondary | ICD-10-CM | POA: Diagnosis not present

## 2020-08-10 DIAGNOSIS — E1169 Type 2 diabetes mellitus with other specified complication: Secondary | ICD-10-CM | POA: Diagnosis not present

## 2020-08-10 DIAGNOSIS — Z6836 Body mass index (BMI) 36.0-36.9, adult: Secondary | ICD-10-CM

## 2020-08-10 DIAGNOSIS — I10 Essential (primary) hypertension: Secondary | ICD-10-CM | POA: Diagnosis not present

## 2020-08-10 DIAGNOSIS — Z1159 Encounter for screening for other viral diseases: Secondary | ICD-10-CM

## 2020-08-10 LAB — POCT GLYCOSYLATED HEMOGLOBIN (HGB A1C): HbA1c, POC (controlled diabetic range): 8.2 % — AB (ref 0.0–7.0)

## 2020-08-10 MED ORDER — TRULICITY 0.75 MG/0.5ML ~~LOC~~ SOAJ
0.7500 mg | SUBCUTANEOUS | 2 refills | Status: DC
Start: 2020-08-10 — End: 2020-10-01

## 2020-08-10 MED ORDER — INSULIN GLARGINE 100 UNIT/ML ~~LOC~~ SOLN: 20.0000 [IU] | Freq: Every day | SUBCUTANEOUS | 3 refills | Status: DC

## 2020-08-10 NOTE — Assessment & Plan Note (Signed)
We discussed benefits of wt loss as well as adverse effects of obesity. Consistency with healthy diet and physical activity recommended.  

## 2020-08-10 NOTE — Assessment & Plan Note (Addendum)
HgA1C improved but still not at goal. Today he agrees with adding a GLP 1, Trulicity samples given. Side effect discussed No changes in Lantus or empagliflozin-Metformin dose. We discussed possible complications of poorly controlled glucose. Adequate foot care. Recommend arranging appointment for eye exam. Follow-up in 4 months.

## 2020-08-10 NOTE — Patient Instructions (Addendum)
A few things to remember from today's visit:   Type 2 diabetes mellitus with other specified complication, with long-term current use of insulin (HCC) - Plan: POC HgB A1c  Encounter for HCV screening test for low risk patient  Today we are adding Trulicity. Start 0.75 mg weekly and can be increased to 1.5 mg weekly. No changes in rest. If you improve dietary habits we could try to stop Lantus.  If you need refills please call your pharmacy. Do not use My Chart to request refills or for acute issues that need immediate attention.    Please be sure medication list is accurate. If a new problem present, please set up appointment sooner than planned today.

## 2020-08-10 NOTE — Progress Notes (Signed)
HPI: AustinAustin Fuller is a 44 y.o. male, who is here today for 4 months follow up.   He was last seen on 04/05/20.  DM II: Dx'ed in 2017. BS's 200's He is on Lantus 20 U daily and Empagliflozin-Metformin 02-999 mg 1 tab bid. Negative for hypoglycemic events. He has not been consistent with following a healthful diet. Snacking on chocolate and chocolate cookies chips. HgA1C on 04/05/20 was 12.8 (15.9).  Increased activity at work, walking more. Negative for abdominal pain, nausea,vomiting, polydipsia,polyuria, or polyphagia.  He would like to be able to stop Lantus,so he can donate plasma.   HTN: Dx'ed in 2017. He is on Diovan 80 mg daily. Negative for severe/frequent headache, visual changes, chest pain, dyspnea, palpitation, claudication, focal weakness, or edema. Component     Latest Ref Rng & Units 12/22/2019  Sodium     135 - 146 mmol/L 138  Potassium     3.5 - 5.3 mmol/L 4.3  Chloride     98 - 110 mmol/L 100  CO2     20 - 32 mmol/L 32  Glucose     65 - 99 mg/dL 563 (H)  BUN     7 - 25 mg/dL 11  Creatinine     8.75 - 1.35 mg/dL 6.43   Review of Systems  Constitutional: Negative for activity change, appetite change, fatigue and fever.  HENT: Negative for mouth sores, nosebleeds and sore throat.   Respiratory: Negative for cough and wheezing.   Genitourinary: Negative for decreased urine volume, dysuria and hematuria.  Neurological: Negative for syncope, facial asymmetry and weakness.  Rest of ROS, see pertinent positives sand negatives in HPI  Current Outpatient Medications on File Prior to Visit  Medication Sig Dispense Refill  . B-D INS SYR ULTRAFINE 1CC/30G 30G X 1/2" 1 ML MISC Inject 1 Device into the skin daily. 100 each 3  . cholecalciferol (VITAMIN D3) 25 MCG (1000 UNIT) tablet Take 4,000 Units by mouth daily.    . Continuous Blood Gluc Receiver (FREESTYLE LIBRE 14 DAY READER) DEVI Use as directed to test blood sugar 1 each 0  . Continuous  Blood Gluc Sensor (FREESTYLE LIBRE 14 DAY SENSOR) MISC USE TO TEST BLOOD SUGAR AS DIRECTED 1 each 3  . Empagliflozin-metFORMIN HCl 02-999 MG TABS Take 1 tablet by mouth 2 (two) times daily. 60 tablet 3  . rosuvastatin (CRESTOR) 10 MG tablet Take 1 tablet (10 mg total) by mouth daily. 90 tablet 2  . valsartan (DIOVAN) 80 MG tablet TAKE 1 TABLET(80 MG) BY MOUTH DAILY 90 tablet 1   No current facility-administered medications on file prior to visit.   Past Medical History:  Diagnosis Date  . Diabetes mellitus without complication (HCC)   . Hyperlipidemia   . Hypertension    No Known Allergies  Social History   Socioeconomic History  . Marital status: Single    Spouse name: Not on file  . Number of children: Not on file  . Years of education: Not on file  . Highest education level: Not on file  Occupational History  . Not on file  Tobacco Use  . Smoking status: Never Smoker  . Smokeless tobacco: Never Used  Substance and Sexual Activity  . Alcohol use: Not Currently  . Drug use: Never  . Sexual activity: Not Currently  Other Topics Concern  . Not on file  Social History Narrative  . Not on file   Social Determinants of Health   Financial Resource  Strain:   . Difficulty of Paying Living Expenses: Not on file  Food Insecurity:   . Worried About Programme researcher, broadcasting/film/video in the Last Year: Not on file  . Ran Out of Food in the Last Year: Not on file  Transportation Needs:   . Lack of Transportation (Medical): Not on file  . Lack of Transportation (Non-Medical): Not on file  Physical Activity:   . Days of Exercise per Week: Not on file  . Minutes of Exercise per Session: Not on file  Stress:   . Feeling of Stress : Not on file  Social Connections:   . Frequency of Communication with Friends and Family: Not on file  . Frequency of Social Gatherings with Friends and Family: Not on file  . Attends Religious Services: Not on file  . Active Member of Clubs or Organizations: Not on  file  . Attends Banker Meetings: Not on file  . Marital Status: Not on file    Vitals:   08/10/20 1059  BP: 120/70  Pulse: 87  Resp: 16  SpO2: 92%   Wt Readings from Last 3 Encounters:  08/10/20 249 lb (112.9 kg)  04/05/20 242 lb 8 oz (110 kg)  02/09/20 239 lb (108.4 kg)   Body mass index is 39 kg/m.  Physical Exam Nursing note reviewed.  Constitutional:      General: He is not in acute distress.    Appearance: He is well-developed.  HENT:     Head: Normocephalic and atraumatic.     Mouth/Throat:     Mouth: Mucous membranes are moist.     Pharynx: Oropharynx is clear.  Eyes:     Conjunctiva/sclera: Conjunctivae normal.     Pupils: Pupils are equal, round, and reactive to light.  Cardiovascular:     Rate and Rhythm: Normal rate and regular rhythm.     Pulses:          Dorsalis pedis pulses are 2+ on the right side and 2+ on the left side.     Heart sounds: No murmur heard.   Pulmonary:     Effort: Pulmonary effort is normal. No respiratory distress.     Breath sounds: Normal breath sounds.  Abdominal:     Palpations: Abdomen is soft. There is no hepatomegaly or mass.     Tenderness: There is no abdominal tenderness.  Lymphadenopathy:     Cervical: No cervical adenopathy.  Skin:    General: Skin is warm.     Findings: No erythema or rash.  Neurological:     General: No focal deficit present.     Mental Status: He is alert and oriented to person, place, and time.     Cranial Nerves: No cranial nerve deficit.     Gait: Gait normal.  Psychiatric:     Comments: Well groomed, good eye contact.   ASSESSMENT AND PLAN:  Austin Fuller was seen today for 4 months follow-up.  Orders Placed This Encounter  Procedures  . COMPLETE METABOLIC PANEL WITH GFR  . Hepatitis C antibody  . POC HgB A1c   Lab Results  Component Value Date   HGBA1C 8.2 (A) 08/10/2020   Lab Results  Component Value Date   CREATININE 1.00 08/10/2020   BUN 10  08/10/2020   NA 142 08/10/2020   K 4.6 08/10/2020   CL 105 08/10/2020   CO2 23 08/10/2020   Lab Results  Component Value Date   ALT 56 (H) 08/10/2020  AST 32 08/10/2020   ALKPHOS 146 (H) 12/22/2019   BILITOT 0.9 08/10/2020   Type 2 diabetes mellitus with other specified complication (HCC) HgA1C improved but still not at goal. Today he agrees with adding a GLP 1, Trulicity samples given. Side effect discussed No changes in Lantus or empagliflozin-Metformin dose. We discussed possible complications of poorly controlled glucose. Adequate foot care. Recommend arranging appointment for eye exam. Follow-up in 4 months.  Hypertension, essential, benign BP adequately controlled. Low salt diet recommended. Continue Valsartan 80 mg daily. Needs an eye exam.  Class 2 obesity with body mass index (BMI) of 36.0 to 36.9 in adult We discussed benefits of wt loss as well as adverse effects of obesity. Consistency with healthy diet and physical activity recommended.  Encounter for HCV screening test for low risk patient - Hepatitis C antibody   Return in about 4 months (around 12/11/2020).  Braulio Kiedrowski G. [place], MD  [facility] [place] office.   A few things to remember from today's visit:   Type 2 diabetes mellitus with other specified complication, with long-term current use of insulin (HCC) - Plan: POC HgB A1c  Encounter for HCV screening test for low risk patient  Today we are adding Trulicity. Start 0.75 mg weekly and can be increased to 1.5 mg weekly. No changes in rest. If you improve dietary habits we could try to stop Lantus.  If you need refills please call your pharmacy. Do not use My Chart to request refills or for acute issues that need immediate attention.    Please be sure medication list is accurate. If a new problem present, please set up appointment sooner than planned today.

## 2020-08-10 NOTE — Assessment & Plan Note (Addendum)
BP adequately controlled. Low salt diet recommended. Continue Valsartan 80 mg daily. Needs an eye exam.

## 2020-08-11 LAB — COMPLETE METABOLIC PANEL WITH GFR
AG Ratio: 2 (calc) (ref 1.0–2.5)
ALT: 56 U/L — ABNORMAL HIGH (ref 9–46)
AST: 32 U/L (ref 10–40)
Albumin: 4.5 g/dL (ref 3.6–5.1)
Alkaline phosphatase (APISO): 78 U/L (ref 36–130)
BUN: 10 mg/dL (ref 7–25)
CO2: 23 mmol/L (ref 20–32)
Calcium: 10.1 mg/dL (ref 8.6–10.3)
Chloride: 105 mmol/L (ref 98–110)
Creat: 1 mg/dL (ref 0.60–1.35)
GFR, Est African American: 106 mL/min/{1.73_m2} (ref >=60)
GFR, Est Non African American: 91 mL/min/{1.73_m2} (ref >=60)
Globulin: 2.3 g/dL (calc) (ref 1.9–3.7)
Glucose, Bld: 120 mg/dL — ABNORMAL HIGH (ref 65–99)
Potassium: 4.6 mmol/L (ref 3.5–5.3)
Sodium: 142 mmol/L (ref 135–146)
Total Bilirubin: 0.9 mg/dL (ref 0.2–1.2)
Total Protein: 6.8 g/dL (ref 6.1–8.1)

## 2020-08-11 LAB — HEPATITIS C ANTIBODY
Hepatitis C Ab: NONREACTIVE
SIGNAL TO CUT-OFF: 0.1 (ref <1.00)

## 2020-08-14 ENCOUNTER — Encounter: Payer: Self-pay | Admitting: Family Medicine

## 2020-10-01 ENCOUNTER — Telehealth: Payer: Self-pay | Admitting: Family Medicine

## 2020-10-01 DIAGNOSIS — I10 Essential (primary) hypertension: Secondary | ICD-10-CM

## 2020-10-01 DIAGNOSIS — E1169 Type 2 diabetes mellitus with other specified complication: Secondary | ICD-10-CM

## 2020-10-01 DIAGNOSIS — Z794 Long term (current) use of insulin: Secondary | ICD-10-CM

## 2020-10-01 MED ORDER — EMPAGLIFLOZIN-METFORMIN HCL 5-1000 MG PO TABS: 1.0000 | ORAL_TABLET | Freq: Two times a day (BID) | ORAL | 3 refills | Status: DC

## 2020-10-01 MED ORDER — TRULICITY 0.75 MG/0.5ML ~~LOC~~ SOAJ
0.7500 mg | SUBCUTANEOUS | 2 refills | Status: DC
Start: 2020-10-01 — End: 2021-11-14

## 2020-10-01 MED ORDER — ROSUVASTATIN CALCIUM 10 MG PO TABS: 10.0000 mg | ORAL_TABLET | Freq: Every day | ORAL | 2 refills | Status: DC

## 2020-10-01 MED ORDER — VALSARTAN 80 MG PO TABS: ORAL_TABLET | ORAL | 1 refills | Status: DC

## 2020-10-01 MED ORDER — INSULIN GLARGINE 100 UNIT/ML ~~LOC~~ SOLN: 20.0000 [IU] | Freq: Every day | SUBCUTANEOUS | 3 refills | Status: DC

## 2020-10-01 NOTE — Telephone Encounter (Signed)
Patient is calling and is requesting a refill for all medications except for syringes and lancets sent to [facility] in [place], please advise. CB is [phone]

## 2020-10-01 NOTE — Telephone Encounter (Signed)
Rx's sent in. °

## 2021-07-04 ENCOUNTER — Ambulatory Visit (INDEPENDENT_AMBULATORY_CARE_PROVIDER_SITE_OTHER): Payer: PRIVATE HEALTH INSURANCE | Admitting: Family Medicine

## 2021-07-04 ENCOUNTER — Other Ambulatory Visit: Payer: PRIVATE HEALTH INSURANCE

## 2021-07-04 ENCOUNTER — Other Ambulatory Visit: Payer: Self-pay

## 2021-07-04 ENCOUNTER — Encounter: Payer: Self-pay | Admitting: Family Medicine

## 2021-07-04 VITALS — BP 128/80 | HR 94 | Resp 16 | Ht 67.0 in | Wt 204.1 lb

## 2021-07-04 DIAGNOSIS — Z794 Long term (current) use of insulin: Secondary | ICD-10-CM

## 2021-07-04 DIAGNOSIS — E1169 Type 2 diabetes mellitus with other specified complication: Secondary | ICD-10-CM

## 2021-07-04 DIAGNOSIS — R7989 Other specified abnormal findings of blood chemistry: Secondary | ICD-10-CM

## 2021-07-04 DIAGNOSIS — I10 Essential (primary) hypertension: Secondary | ICD-10-CM | POA: Diagnosis not present

## 2021-07-04 DIAGNOSIS — R945 Abnormal results of liver function studies: Secondary | ICD-10-CM | POA: Diagnosis not present

## 2021-07-04 DIAGNOSIS — E785 Hyperlipidemia, unspecified: Secondary | ICD-10-CM

## 2021-07-04 LAB — HEPATIC FUNCTION PANEL
ALT: 13 U/L (ref 0–53)
AST: 10 U/L (ref 0–37)
Albumin: 4.3 g/dL (ref 3.5–5.2)
Alkaline Phosphatase: 125 U/L — ABNORMAL HIGH (ref 39–117)
Bilirubin, Direct: 0.1 mg/dL (ref 0.0–0.3)
Total Bilirubin: 0.6 mg/dL (ref 0.2–1.2)
Total Protein: 6.8 g/dL (ref 6.0–8.3)

## 2021-07-04 LAB — LIPID PANEL
Cholesterol: 249 mg/dL — ABNORMAL HIGH (ref 0–200)
HDL: 40.7 mg/dL (ref >39.00)
Total CHOL/HDL Ratio: 6
Triglycerides: 438 mg/dL — ABNORMAL HIGH (ref 0.0–149.0)

## 2021-07-04 LAB — BASIC METABOLIC PANEL
BUN: 13 mg/dL (ref 6–23)
CO2: 28 mEq/L (ref 19–32)
Calcium: 10.1 mg/dL (ref 8.4–10.5)
Chloride: 97 mEq/L (ref 96–112)
Creatinine, Ser: 0.97 mg/dL (ref 0.40–1.50)
GFR: 94.46 mL/min (ref >60.00)
Glucose, Bld: 483 mg/dL — ABNORMAL HIGH (ref 70–99)
Potassium: 4.6 mEq/L (ref 3.5–5.1)
Sodium: 134 mEq/L — ABNORMAL LOW (ref 135–145)

## 2021-07-04 LAB — MICROALBUMIN / CREATININE URINE RATIO
Creatinine,U: 26.3 mg/dL
Microalb Creat Ratio: 2.7 mg/g (ref 0.0–30.0)
Microalb, Ur: <0.7 mg/dL (ref 0.0–1.9)

## 2021-07-04 LAB — LDL CHOLESTEROL, DIRECT: Direct LDL: 138 mg/dL

## 2021-07-04 NOTE — Patient Instructions (Addendum)
A few things to remember from today's visit:  Type 2 diabetes mellitus with other specified complication, with long-term current use of insulin (HCC) - Plan: Hemoglobin A1c, Basic metabolic panel, Microalbumin / creatinine urine ratio, Fructosamine, Hepatic function panel, CANCELED: POC HgB A1c  Hyperlipidemia associated with type 2 diabetes mellitus (HCC) - Plan: Lipid panel  Abnormal LFTs  If you need refills please call your pharmacy. Do not use My Chart to request refills or for acute issues that need immediate attention.   Samples of Trulicity and Basaglar to start at same dose you were taking. Trulicity can be increased to 1.5 mg after finishing 0.75. Try to apply for patient assistance program.  Please be sure medication list is accurate. If a new problem present, please set up appointment sooner than planned today.  Diabetes Mellitus and Nutrition, Adult When you have diabetes, or diabetes mellitus, it is very important to have healthy eating habits because your blood sugar (glucose) levels are greatly affected by what you eat and drink. Eating healthy foods in the right amounts, at about the same times every day, can help you: Control your blood glucose. Lower your risk of heart disease. Improve your blood pressure. Reach or maintain a healthy weight. What can affect my meal plan? Every person with diabetes is different, and each person has different needs for a meal plan. Your health care provider may recommend that you work with a dietitian to make a meal plan that is best for you. Your meal plan may vary depending on factors such as: The calories you need. The medicines you take. Your weight. Your blood glucose, blood pressure, and cholesterol levels. Your activity level. Other health conditions you have, such as heart or kidney disease. How do carbohydrates affect me? Carbohydrates, also called carbs, affect your blood glucose level more than any other type of food.  Eating carbs naturally raises the amount of glucose in your blood. Carb counting is a method for keeping track of how many carbs you eat. Counting carbs is important to keep your blood glucose at a healthy level, especially if you use insulin or take certain oral diabetes medicines. It is important to know how many carbs you can safely have in each meal. This is different for every person. Your dietitian can help you calculate how many carbs you should have at each meal and for each snack. How does alcohol affect me? Alcohol can cause a sudden decrease in blood glucose (hypoglycemia), especially if you use insulin or take certain oral diabetes medicines. Hypoglycemia can be a life-threatening condition. Symptoms of hypoglycemia, such as sleepiness, dizziness, and confusion, are similar to symptoms of having too much alcohol. Do not drink alcohol if: Your health care provider tells you not to drink. You are pregnant, may be pregnant, or are planning to become pregnant. If you drink alcohol: Do not drink on an empty stomach. Limit how much you use to: 0-1 drink a day for women. 0-2 drinks a day for men. Be aware of how much alcohol is in your drink. In the [place] one drink equals one 12 oz bottle of beer (355 mL), one 5 oz glass of wine (148 mL), or one 1 oz glass of hard liquor (44 mL). Keep yourself hydrated with water, diet soda, or unsweetened iced tea. Keep in mind that regular soda, juice, and other mixers may contain a lot of sugar and must be counted as carbs. What are tips for following this plan? Reading food labels Start  by checking the serving size on the "Nutrition Facts" label of packaged foods and drinks. The amount of calories, carbs, fats, and other nutrients listed on the label is based on one serving of the item. Many items contain more than one serving per package. Check the total grams (g) of carbs in one serving. You can calculate the number of servings of carbs in one serving  by dividing the total carbs by 15. For example, if a food has 30 g of total carbs per serving, it would be equal to 2 servings of carbs. Check the number of grams (g) of saturated fats and trans fats in one serving. Choose foods that have a low amount or none of these fats. Check the number of milligrams (mg) of salt (sodium) in one serving. Most people should limit total sodium intake to less than 2,300 mg per day. Always check the nutrition information of foods labeled as "low-fat" or "nonfat." These foods may be higher in added sugar or refined carbs and should be avoided. Talk to your dietitian to identify your daily goals for nutrients listed on the label. Shopping Avoid buying canned, pre-made, or processed foods. These foods tend to be high in fat, sodium, and added sugar. Shop around the outside edge of the grocery store. This is where you will most often find fresh fruits and vegetables, bulk grains, fresh meats, and fresh dairy. Cooking Use low-heat cooking methods, such as baking, instead of high-heat cooking methods like deep frying. Cook using healthy oils, such as olive, canola, or sunflower oil. Avoid cooking with butter, cream, or high-fat meats. Meal planning Eat meals and snacks regularly, preferably at the same times every day. Avoid going long periods of time without eating. Eat foods that are high in fiber, such as fresh fruits, vegetables, beans, and whole grains. Talk with your dietitian about how many servings of carbs you can eat at each meal. Eat 4-6 oz (112-168 g) of lean protein each day, such as lean meat, chicken, fish, eggs, or tofu. One ounce (oz) of lean protein is equal to: 1 oz (28 g) of meat, chicken, or fish. 1 egg.  cup (62 g) of tofu. Eat some foods each day that contain healthy fats, such as avocado, nuts, seeds, and fish. What foods should I eat? Fruits Berries. Apples. Oranges. Peaches. Apricots. Plums. Grapes. Mango. Papaya. Pomegranate. Kiwi.  Cherries. Vegetables Lettuce. Spinach. Leafy greens, including kale, chard, collard greens, and mustard greens. Beets. Cauliflower. Cabbage. Broccoli. Carrots. Green beans. Tomatoes. Peppers. Onions. Cucumbers. Brussels sprouts. Grains Whole grains, such as whole-wheat or whole-grain bread, crackers, tortillas, cereal, and pasta. Unsweetened oatmeal. Quinoa. Brown or wild rice. Meats and other proteins Seafood. Poultry without skin. Lean cuts of poultry and beef. Tofu. Nuts. Seeds. Dairy Low-fat or fat-free dairy products such as milk, yogurt, and cheese. The items listed above may not be a complete list of foods and beverages you can eat. Contact a dietitian for more information. What foods should I avoid? Fruits Fruits canned with syrup. Vegetables Canned vegetables. Frozen vegetables with butter or cream sauce. Grains Refined white flour and flour products such as bread, pasta, snack foods, and cereals. Avoid all processed foods. Meats and other proteins Fatty cuts of meat. Poultry with skin. Breaded or fried meats. Processed meat. Avoid saturated fats. Dairy Full-fat yogurt, cheese, or milk. Beverages Sweetened drinks, such as soda or iced tea. The items listed above may not be a complete list of foods and beverages you should avoid. Contact  a dietitian for more information. Questions to ask a health care provider Do I need to meet with a diabetes educator? Do I need to meet with a dietitian? What number can I call if I have questions? When are the best times to check my blood glucose? Where to find more information: American Diabetes Association: diabetes.org Academy of Nutrition and Dietetics: www.eatright.[employer] of Diabetes and Digestive and Kidney Diseases: CarFlippers.tn Association of Diabetes Care and Education Specialists: www.diabeteseducator.org Summary It is important to have healthy eating habits because your blood sugar (glucose) levels are  greatly affected by what you eat and drink. A healthy meal plan will help you control your blood glucose and maintain a healthy lifestyle. Your health care provider may recommend that you work with a dietitian to make a meal plan that is best for you. Keep in mind that carbohydrates (carbs) and alcohol have immediate effects on your blood glucose levels. It is important to count carbs and to use alcohol carefully. This information is not intended to replace advice given to you by your health care provider. Make sure you discuss any questions you have with your health care provider. Document Revised: 09/09/2019 Document Reviewed: 09/09/2019 Elsevier Patient Education  2021 [employer].

## 2021-07-04 NOTE — Progress Notes (Signed)
HPI:  Austin Fuller is a 45 y.o. male, who is here today for diabetes follow up.   He was last seen on 08/10/20, he lost his insurance; he now has medical insurance but no prescription insurance.  Diabetes Mellitus II: Dx'ed in 2017. - Checking BG at home: Not checking. - Medications: No medications since 09/2021.He was supposed to be on Trulicity ,Omphali-Metformin,and Lantus. - Compliance: Poor. - Diet: Not consistently, still snacking on cookies, trying to do better.He has lost some wt since his last visit. - Exercise: Not consistently. - eye exam: Overdue,planning on arranging appt. - foot exam: > a year ago.  - Negative for symptoms of hypoglycemia, foot ulcers/trauma + Polyuria, polydipsia.  L>R needle pain and numbness sensation. Eye exam a while ago, planning on arranging appt. He has noted some changes in vision, gradual.  Lab Results  Component Value Date   HGBA1C 8.2 (A) 08/10/2020   Lab Results  Component Value Date   MICROALBUR 0.8 12/22/2019   Lab Results  Component Value Date   ALT 56 (H) 08/10/2020   AST 32 08/10/2020   ALKPHOS 146 (H) 12/22/2019   BILITOT 0.9 08/10/2020   Alk phos was normal in 07/2020, 78.  Hyperlipidemia: Currently on non pharmacologic treatment.He was supposed to be on Crestor 10 mg daily. Following a low fat diet: Not consistently.. Side effects from medication:None Lab Results  Component Value Date   CHOL 150 12/22/2019   HDL 36.30 (L) 12/22/2019   LDLDIRECT 78.0 12/22/2019   TRIG 242.0 (H) 12/22/2019   CHOLHDL 4 12/22/2019   HTN: He is not taking Valsartan 80 mg daily. Negative for severe/frequent headache, visual changes, chest pain, dyspnea, palpitation, claudication, focal weakness, or edema.  Negative for high alcohol consumption.  Component     Latest Ref Rng & Units 08/10/2020  Glucose     70 - 99 mg/dL 120 (H)  BUN     6 - 23 mg/dL 10  Creatinine     0.40 - 1.50 mg/dL 1.00  GFR, Est Non  African American     > OR = 60 mL/min/1.93m 91  GFR, Est African American     > OR = 60 mL/min/1.773m106  BUN/Creatinine Ratio     6 - 22 (calc) NOT APPLICABLE  Sodium     13097 145 mEq/L 142  Potassium     3.5 - 5.1 mEq/L 4.6  Chloride     96 - 112 mEq/L 105  CO2     19 - 32 mEq/L 23  Calcium     8.4 - 10.5 mg/dL 10.1  Total Protein     6.0 - 8.3 g/dL 6.8  Albumin MSPROF     3.6 - 5.1 g/dL 4.5  Globulin     1.9 - 3.7 g/dL (calc) 2.3  AG Ratio     1.0 - 2.5 (calc) 2.0  Total Bilirubin     0.2 - 1.2 mg/dL 0.9  Alkaline phosphatase (APISO)     36 - 130 U/L 78  AST     0 - 37 U/L 32  ALT     0 - 53 U/L 56 (H)   Review of Systems  Constitutional:  Positive for fatigue. Negative for activity change, appetite change and fever.  HENT:  Negative for nosebleeds and sore throat.   Respiratory:  Negative for cough and wheezing.   Gastrointestinal:  Negative for abdominal pain, nausea and vomiting.  Genitourinary:  Negative for decreased urine  volume and hematuria.  Musculoskeletal:  Negative for gait problem and myalgias.  Neurological:  Negative for syncope, facial asymmetry and weakness.  Rest of ROS, see pertinent positives sand negatives in HPI  Current Outpatient Medications on File Prior to Visit  Medication Sig Dispense Refill   B-D INS SYR ULTRAFINE 1CC/30G 30G X 1/2" 1 ML MISC Inject 1 Device into the skin daily. (Patient not taking: Reported on 07/04/2021) 100 each 3   cholecalciferol (VITAMIN D3) 25 MCG (1000 UNIT) tablet Take 4,000 Units by mouth daily. (Patient not taking: Reported on 07/04/2021)     Continuous Blood Gluc Receiver (FREESTYLE LIBRE 14 DAY READER) DEVI Use as directed to test blood sugar (Patient not taking: Reported on 07/04/2021) 1 each 0   Continuous Blood Gluc Sensor (FREESTYLE LIBRE 14 DAY SENSOR) MISC USE TO TEST BLOOD SUGAR AS DIRECTED (Patient not taking: Reported on 07/04/2021) 1 each 3   Dulaglutide (TRULICITY) 3.30 QT/6.2UQ SOPN Inject 0.75  mg into the skin once a week. (Patient not taking: Reported on 07/04/2021) 6 mL 2   Empagliflozin-metFORMIN HCl 02-999 MG TABS Take 1 tablet by mouth 2 (two) times daily. (Patient not taking: Reported on 07/04/2021) 60 tablet 3   insulin glargine (LANTUS) 100 UNIT/ML injection Inject 0.2 mLs (20 Units total) into the skin at bedtime. (Patient not taking: Reported on 07/04/2021) 10 mL 3   rosuvastatin (CRESTOR) 10 MG tablet Take 1 tablet (10 mg total) by mouth daily. (Patient not taking: Reported on 07/04/2021) 90 tablet 2   valsartan (DIOVAN) 80 MG tablet TAKE 1 TABLET(80 MG) BY MOUTH DAILY (Patient not taking: Reported on 07/04/2021) 90 tablet 1   No current facility-administered medications on file prior to visit.   Past Medical History:  Diagnosis Date   Diabetes mellitus without complication ([place])    Hyperlipidemia    Hypertension    No Known Allergies  Social History   Socioeconomic History   Marital status: Single    Spouse name: Not on file   Number of children: Not on file   Years of education: Not on file   Highest education level: Not on file  Occupational History   Not on file  Tobacco Use   Smoking status: Never   Smokeless tobacco: Never  Substance and Sexual Activity   Alcohol use: Not Currently   Drug use: Never   Sexual activity: Not Currently  Other Topics Concern   Not on file  Social History Narrative   Not on file   Social Determinants of Health   Financial Resource Strain: Not on file  Food Insecurity: Not on file  Transportation Needs: Not on file  Physical Activity: Not on file  Stress: Not on file  Social Connections: Not on file   Vitals:   07/04/21 0958  BP: 128/80  Pulse: 94  Resp: 16  SpO2: 97%   Wt Readings from Last 3 Encounters:  07/04/21 204 lb 2 oz (92.6 kg)  08/10/20 249 lb (112.9 kg)  04/05/20 242 lb 8 oz (110 kg)   Body mass index is 31.97 kg/m.  Physical Exam Nursing note reviewed.  Constitutional:      General: He is  not in acute distress.    Appearance: He is well-developed.  HENT:     Head: Normocephalic and atraumatic.  Eyes:     Conjunctiva/sclera: Conjunctivae normal.  Cardiovascular:     Rate and Rhythm: Normal rate and regular rhythm.     Pulses:  Dorsalis pedis pulses are 2+ on the right side and 2+ on the left side.     Heart sounds: No murmur heard. Pulmonary:     Effort: Pulmonary effort is normal. No respiratory distress.     Breath sounds: Normal breath sounds.  Abdominal:     Palpations: Abdomen is soft. There is no hepatomegaly or mass.     Tenderness: There is no abdominal tenderness.  Lymphadenopathy:     Cervical: No cervical adenopathy.  Skin:    General: Skin is warm.     Findings: No erythema or rash.  Neurological:     Mental Status: He is alert and oriented to person, place, and time.     Cranial Nerves: No cranial nerve deficit.     Gait: Gait normal.  Psychiatric:     Comments: Well groomed, good eye contact.   Diabetic Foot Exam - Simple   Simple Foot Form Diabetic Foot exam was performed with the following findings: Yes 07/04/2021 10:46 AM  Visual Inspection No deformities, no ulcerations, no other skin breakdown bilaterally: Yes Sensation Testing Intact to touch and monofilament testing bilaterally: Yes Pulse Check Posterior Tibialis and Dorsalis pulse intact bilaterally: Yes Comments    ASSESSMENT AND PLAN:  Austin Fuller was seen today for follow-up.  Orders Placed This Encounter  Procedures   Hemoglobin B0J   Basic metabolic panel   Microalbumin / creatinine urine ratio   Fructosamine   Lipid panel   Hepatic function panel   LDL cholesterol, direct   Lab Results  Component Value Date   HGBA1C >14.0 (H) 07/04/2021   Lab Results  Component Value Date   CHOL 249 (H) 07/04/2021   HDL 40.70 07/04/2021   LDLDIRECT 138.0 07/04/2021   TRIG (H) 07/04/2021    438.0 Triglyceride is over 400; calculations on Lipids are  invalid.   CHOLHDL 6 07/04/2021   Lab Results  Component Value Date   ALT 13 07/04/2021   AST 10 07/04/2021   ALKPHOS 125 (H) 07/04/2021   BILITOT 0.6 07/04/2021   Lab Results  Component Value Date   MICROALBUR <0.7 07/04/2021   MICROALBUR 0.8 12/22/2019   Lab Results  Component Value Date   CREATININE 0.97 07/04/2021   BUN 13 07/04/2021   NA 134 (L) 07/04/2021   K 4.6 07/04/2021   CL 97 07/04/2021   CO2 28 07/04/2021   Type 2 diabetes mellitus with other specified complication, with long-term current use of insulin (HCC) Problem is not well controlled. We discussed possible complications of elevated glucose. Samples of Basaglar and Trulicity given. Basaglar 20 U and Trulicity 6.28 mg weekly x 2 then increase to 1.5 mg weekly. Will try pt assistance program.  Regular exercise and healthy diet with avoidance of added sugar food intake is an important part of treatment and recommended. Annual eye exam, periodic dental and foot care recommended. F/U in 4 months  Hyperlipidemia associated with type 2 diabetes mellitus ([place]) Further recommendations according to lipid panel result. We discussed statins CV benefits.  Abnormal LFTs Continue alcohol avoidance. Further recommendations will be given according to LFT's results.  Hypertension, essential, benign BP adequately controlled. Continue non pharmacologic treatment.  Return in about 4 months (around 11/03/2021).   Keyonni Percival G. [place], MD  [facility]. [place] office.

## 2021-07-05 LAB — HEMOGLOBIN A1C: Hgb A1c MFr Bld: >14 % of total Hgb — ABNORMAL HIGH (ref <5.7)

## 2021-07-07 LAB — FRUCTOSAMINE: Fructosamine: >600 umol/L — ABNORMAL HIGH (ref 205–285)

## 2021-07-11 ENCOUNTER — Other Ambulatory Visit: Payer: Self-pay

## 2021-07-11 MED ORDER — ATORVASTATIN CALCIUM 20 MG PO TABS: 20.0000 mg | ORAL_TABLET | Freq: Every day | ORAL | 3 refills | Status: DC

## 2021-11-01 ENCOUNTER — Other Ambulatory Visit: Payer: Self-pay | Admitting: Family Medicine

## 2021-11-01 DIAGNOSIS — E785 Hyperlipidemia, unspecified: Secondary | ICD-10-CM

## 2021-11-01 DIAGNOSIS — E1169 Type 2 diabetes mellitus with other specified complication: Secondary | ICD-10-CM

## 2021-11-01 DIAGNOSIS — I10 Essential (primary) hypertension: Secondary | ICD-10-CM

## 2021-11-05 ENCOUNTER — Other Ambulatory Visit: Payer: Self-pay | Admitting: Family Medicine

## 2021-11-05 DIAGNOSIS — I10 Essential (primary) hypertension: Secondary | ICD-10-CM

## 2021-11-05 DIAGNOSIS — E1169 Type 2 diabetes mellitus with other specified complication: Secondary | ICD-10-CM

## 2021-11-14 ENCOUNTER — Other Ambulatory Visit: Payer: Self-pay

## 2021-11-14 MED ORDER — TRULICITY 1.5 MG/0.5ML ~~LOC~~ SOAJ: 1.5000 mg | SUBCUTANEOUS | 3 refills | Status: DC

## 2021-11-15 NOTE — Progress Notes (Signed)
HPI: [name] is a 46 y.o. male, who is here today for chronic disease management.  Last seen on 07/04/21.  Hyperlipidemia: Currently on Atorvastatin 20 mg daily. Side effects from medication:None. Lab Results  Component Value Date   CHOL 249 (H) 07/04/2021   HDL 40.70 07/04/2021   LDLDIRECT 138.0 07/04/2021   TRIG (H) 07/04/2021    438.0 Triglyceride is over 400; calculations on Lipids are invalid.   CHOLHDL 6 07/04/2021   Hypertension:  Medications:non pharmacologic treatment, he was on Valsartan before. BP readings at home:Not checking.  Negative for unusual or severe headache, visual changes, exertional chest pain, dyspnea,  focal weakness, or edema.  Lab Results  Component Value Date   CREATININE 0.97 07/04/2021   BUN 13 07/04/2021   NA 134 (L) 07/04/2021   K 4.6 07/04/2021   CL 97 07/04/2021   CO2 28 07/04/2021   Diabetes Mellitus II: Dx'ed in 2017. - Checking BG at home: He has not checked it in a while.  - Medications: Trulicity 1.5 mg daily and Lantus 20 units daily.He could not afford Empaglifozin-metformin. - Diet: "Not good." - Exercise: Not regularly. - eye exam: 09/2021, left eye cataract. He has an appt in a couple month. - foot exam: UTD - Negative for symptoms of hypoglycemia, polyuria, polydipsia, numbness extremities, foot ulcers/trauma  Last HgA1C 14 in 06/2021.  Lab Results  Component Value Date   MICROALBUR <0.7 07/04/2021   Achy all over for a couple months and getting worse. Not sleeping well. Trouble falling and slaying asleep for 2-3 months. He has not tried OTC medications. He has not identified exacerbating or alleviating factors.  Review of Systems  Constitutional:  Positive for fatigue. Negative for activity change, appetite change and fever.  HENT:  Negative for nosebleeds, sore throat and trouble swallowing.   Respiratory:  Negative for cough and wheezing.   Gastrointestinal:  Negative for abdominal pain,  nausea and vomiting.  Endocrine: Negative for cold intolerance and heat intolerance.  Genitourinary:  Negative for decreased urine volume, dysuria and hematuria.  Musculoskeletal:  Positive for myalgias.  Skin:  Negative for rash.  Neurological:  Negative for syncope and facial asymmetry.  Hematological:  Negative for adenopathy. Does not bruise/bleed easily.  Psychiatric/Behavioral:  Positive for sleep disturbance.   Rest of ROS see pertinent positives and negatives in HPI.  Current Outpatient Medications on File Prior to Visit  Medication Sig Dispense Refill   atorvastatin (LIPITOR) 20 MG tablet Take 1 tablet (20 mg total) by mouth daily. 90 tablet 3   B-D INS SYR ULTRAFINE 1CC/30G 30G X 1/2" 1 ML MISC Inject 1 Device into the skin daily. 100 each 3   cholecalciferol (VITAMIN D3) 25 MCG (1000 UNIT) tablet Take 4,000 Units by mouth daily.     Continuous Blood Gluc Receiver (FREESTYLE LIBRE 14 DAY READER) DEVI Use as directed to test blood sugar 1 each 0   Continuous Blood Gluc Sensor (FREESTYLE LIBRE 14 DAY SENSOR) MISC USE TO TEST BLOOD SUGAR AS DIRECTED 1 each 3   Dulaglutide (TRULICITY) 1.5 0000000 SOPN Inject 1.5 mg into the skin once a week. 2 mL 3   insulin glargine (LANTUS) 100 UNIT/ML injection Inject 0.2 mLs (20 Units total) into the skin at bedtime. 10 mL 3   No current facility-administered medications on file prior to visit.    Past Medical History:  Diagnosis Date   Diabetes mellitus without complication (HCC)    Hyperlipidemia    Hypertension  No Known Allergies  Social History   Socioeconomic History   Marital status: Single    Spouse name: Not on file   Number of children: Not on file   Years of education: Not on file   Highest education level: Associate degree: occupational, Scientist, product/process developmenttechnical, or vocational program  Occupational History   Not on file  Tobacco Use   Smoking status: Never   Smokeless tobacco: Never  Substance and Sexual Activity   Alcohol use:  Not Currently   Drug use: Never   Sexual activity: Not Currently  Other Topics Concern   Not on file  Social History Narrative   Not on file   Social Determinants of Health   Financial Resource Strain: Low Risk    Difficulty of Paying Living Expenses: Not very hard  Food Insecurity: No Food Insecurity   Worried About Running Out of Food in the Last Year: Never true   Ran Out of Food in the Last Year: Never true  Transportation Needs: No Transportation Needs   Lack of Transportation (Medical): No   Lack of Transportation (Non-Medical): No  Physical Activity: Sufficiently Active   Days of Exercise per Week: 5 days   Minutes of Exercise per Session: 150+ min  Stress: No Stress Concern Present   Feeling of Stress : Not at all  Social Connections: Unknown   Frequency of Communication with Friends and Family: Not on file   Frequency of Social Gatherings with Friends and Family: Not on file   Attends Religious Services: Not on file   Active Member of Clubs or Organizations: No   Attends BankerClub or Organization Meetings: Not on file   Marital Status: Not on file    Vitals:   11/16/21 0908  BP: 124/80  Pulse: 100  Resp: 16  SpO2: 98%   Body mass index is 33.99 kg/m.  Physical Exam Vitals and nursing note reviewed.  Constitutional:      General: He is not in acute distress.    Appearance: He is well-developed.  HENT:     Head: Normocephalic and atraumatic.     Mouth/Throat:     Mouth: Mucous membranes are moist.     Pharynx: Oropharynx is clear.  Eyes:     Conjunctiva/sclera: Conjunctivae normal.  Cardiovascular:     Rate and Rhythm: Normal rate and regular rhythm.     Pulses:          Dorsalis pedis pulses are 2+ on the right side and 2+ on the left side.     Heart sounds: No murmur heard. Pulmonary:     Effort: Pulmonary effort is normal. No respiratory distress.     Breath sounds: Normal breath sounds.  Abdominal:     Palpations: Abdomen is soft. There is no  hepatomegaly or mass.     Tenderness: There is no abdominal tenderness.  Musculoskeletal:     Comments: No tender trigger points. No signs of synovitis.  Lymphadenopathy:     Cervical: No cervical adenopathy.  Skin:    General: Skin is warm.     Findings: No erythema or rash.  Neurological:     Mental Status: He is alert and oriented to person, place, and time.     Cranial Nerves: No cranial nerve deficit.     Gait: Gait normal.  Psychiatric:     Comments: Well groomed, good eye contact.   ASSESSMENT AND PLAN:  Mr.An was seen today for follow-up.  Diagnoses and all orders for this visit: Orders  Placed This Encounter  Procedures   Comprehensive metabolic panel   Lipid panel   TSH   CK   CBC   POC HgB A1c   Lab Results  Component Value Date   HGBA1C 7.8 (A) 11/16/2021   Lab Results  Component Value Date   CHOL 140 11/16/2021   HDL 38.30 (L) 11/16/2021   LDLCALC 70 11/16/2021   LDLDIRECT 138.0 07/04/2021   TRIG 157.0 (H) 11/16/2021   CHOLHDL 4 11/16/2021   Lab Results  Component Value Date   WBC 5.1 11/16/2021   HGB 15.5 11/16/2021   HCT 45.0 11/16/2021   MCV 91.9 11/16/2021   PLT 260.0 11/16/2021   Lab Results  Component Value Date   ALT 15 11/16/2021   AST 15 11/16/2021   ALKPHOS 62 11/16/2021   BILITOT 0.6 11/16/2021   Lab Results  Component Value Date   TSH 1.47 11/16/2021   Type 2 diabetes mellitus with other specified complication, with long-term current use of insulin ([place]) HgA1C still not at goal but greatly improved, down from 14 to 7.8. He has not tried Metformin in the past, so 500 mg am and 1000 mg pm with food added today. No changes in Trulicity or Lantus. Regular exercise and healthy diet with avoidance of added sugar food intake is an important part of treatment and encouraged. Annual eye exam, periodic dental and foot care recommended. F/U in 5-6 months  -     metFORMIN (GLUCOPHAGE) 500 MG tablet; 1 tab with breakfast and 2 tabs  with supper.  Hyperlipidemia associated with type 2 diabetes mellitus (HCC) Continue Atorvastatin 20 mg daily. Low fat diet also recommended. Further recommendations according to lab result.  Hypertension, essential, benign BP still adequately controlled , so continue non pharmacologic treatment. Low salt diet. Monitor BP regularly.  Insomnia, unspecified type Good sleep hygiene recommended. OTC sleeps aid can be tried.  Myalgia We discussed possible etiologies. Hx and examination do not suggest a serious process. Monitor for new symptoms. Further recommendations according to lab results.  Colon cancer screening FIT cards given.  Return in about 4 months (around 03/16/2022).  Bronda Alfred G. [place], MD  [facility] [place]. [place] office.

## 2021-11-16 ENCOUNTER — Encounter: Payer: Self-pay | Admitting: Family Medicine

## 2021-11-16 ENCOUNTER — Ambulatory Visit (INDEPENDENT_AMBULATORY_CARE_PROVIDER_SITE_OTHER): Payer: PRIVATE HEALTH INSURANCE | Admitting: Family Medicine

## 2021-11-16 VITALS — BP 124/80 | HR 100 | Resp 16 | Ht 67.0 in | Wt 217.0 lb

## 2021-11-16 DIAGNOSIS — Z1211 Encounter for screening for malignant neoplasm of colon: Secondary | ICD-10-CM

## 2021-11-16 DIAGNOSIS — M791 Myalgia, unspecified site: Secondary | ICD-10-CM | POA: Diagnosis not present

## 2021-11-16 DIAGNOSIS — I10 Essential (primary) hypertension: Secondary | ICD-10-CM | POA: Diagnosis not present

## 2021-11-16 DIAGNOSIS — Z794 Long term (current) use of insulin: Secondary | ICD-10-CM | POA: Diagnosis not present

## 2021-11-16 DIAGNOSIS — E1169 Type 2 diabetes mellitus with other specified complication: Secondary | ICD-10-CM

## 2021-11-16 DIAGNOSIS — E785 Hyperlipidemia, unspecified: Secondary | ICD-10-CM | POA: Diagnosis not present

## 2021-11-16 DIAGNOSIS — G4700 Insomnia, unspecified: Secondary | ICD-10-CM

## 2021-11-16 LAB — LIPID PANEL
Cholesterol: 140 mg/dL (ref 0–200)
HDL: 38.3 mg/dL — ABNORMAL LOW (ref >39.00)
LDL Cholesterol: 70 mg/dL (ref 0–99)
NonHDL: 101.39
Total CHOL/HDL Ratio: 4
Triglycerides: 157 mg/dL — ABNORMAL HIGH (ref 0.0–149.0)
VLDL: 31.4 mg/dL (ref 0.0–40.0)

## 2021-11-16 LAB — COMPREHENSIVE METABOLIC PANEL WITH GFR
ALT: 15 U/L (ref 0–53)
AST: 15 U/L (ref 0–37)
Albumin: 4.5 g/dL (ref 3.5–5.2)
Alkaline Phosphatase: 62 U/L (ref 39–117)
BUN: 7 mg/dL (ref 6–23)
CO2: 31 meq/L (ref 19–32)
Calcium: 9.9 mg/dL (ref 8.4–10.5)
Chloride: 106 meq/L (ref 96–112)
Creatinine, Ser: 0.93 mg/dL (ref 0.40–1.50)
GFR: 99.1 mL/min
Glucose, Bld: 148 mg/dL — ABNORMAL HIGH (ref 70–99)
Potassium: 4.2 meq/L (ref 3.5–5.1)
Sodium: 142 meq/L (ref 135–145)
Total Bilirubin: 0.6 mg/dL (ref 0.2–1.2)
Total Protein: 7.1 g/dL (ref 6.0–8.3)

## 2021-11-16 LAB — POCT GLYCOSYLATED HEMOGLOBIN (HGB A1C): Hemoglobin A1C: 7.8 % — AB (ref 4.0–5.6)

## 2021-11-16 LAB — CBC
HCT: 45 % (ref 39.0–52.0)
Hemoglobin: 15.5 g/dL (ref 13.0–17.0)
MCHC: 34.4 g/dL (ref 30.0–36.0)
MCV: 91.9 fl (ref 78.0–100.0)
Platelets: 260 10*3/uL (ref 150.0–400.0)
RBC: 4.9 Mil/uL (ref 4.22–5.81)
RDW: 12.9 % (ref 11.5–15.5)
WBC: 5.1 10*3/uL (ref 4.0–10.5)

## 2021-11-16 LAB — CK: Total CK: 26 U/L (ref 7–232)

## 2021-11-16 LAB — TSH: TSH: 1.47 u[IU]/mL (ref 0.35–5.50)

## 2021-11-16 MED ORDER — METFORMIN HCL 500 MG PO TABS: ORAL_TABLET | ORAL | 1 refills | Status: DC

## 2021-11-16 NOTE — Patient Instructions (Addendum)
A few things to remember from today's visit:   Type 2 diabetes mellitus with other specified complication, with long-term current use of insulin (HCC) - Plan: POC HgB A1c, metFORMIN (GLUCOPHAGE) 500 MG tablet  Hyperlipidemia associated with type 2 diabetes mellitus (HCC) - Plan: Comprehensive metabolic panel, Lipid panel  Hypertension, essential, benign  Insomnia, unspecified type - Plan: TSH  Myalgia - Plan: CK, CBC  If you need refills please call your pharmacy. Do not use My Chart to request refills or for acute issues that need immediate attention.   ZZquil or Unisom can be tried for sleep. Hold on Atorvastatin for  3 weeks and monitor for changes. Metformin added today, no changes in rest.  Please be sure medication list is accurate. If a new problem present, please set up appointment sooner than planned today. Diabetes Mellitus and Nutrition, Adult When you have diabetes, or diabetes mellitus, it is very important to have healthy eating habits because your blood sugar (glucose) levels are greatly affected by what you eat and drink. Eating healthy foods in the right amounts, at about the same times every day, can help you: Manage your blood glucose. Lower your risk of heart disease. Improve your blood pressure. Reach or maintain a healthy weight. What can affect my meal plan? Every person with diabetes is different, and each person has different needs for a meal plan. Your health care provider may recommend that you work with a dietitian to make a meal plan that is best for you. Your meal plan may vary depending on factors such as: The calories you need. The medicines you take. Your weight. Your blood glucose, blood pressure, and cholesterol levels. Your activity level. Other health conditions you have, such as heart or kidney disease. How do carbohydrates affect me? Carbohydrates, also called carbs, affect your blood glucose level more than any other type of food. Eating  carbs raises the amount of glucose in your blood. It is important to know how many carbs you can safely have in each meal. This is different for every person. Your dietitian can help you calculate how many carbs you should have at each meal and for each snack. How does alcohol affect me? Alcohol can cause a decrease in blood glucose (hypoglycemia), especially if you use insulin or take certain diabetes medicines by mouth. Hypoglycemia can be a life-threatening condition. Symptoms of hypoglycemia, such as sleepiness, dizziness, and confusion, are similar to symptoms of having too much alcohol. Do not drink alcohol if: Your health care provider tells you not to drink. You are pregnant, may be pregnant, or are planning to become pregnant. If you drink alcohol: Limit how much you have to: 0-1 drink a day for women. 0-2 drinks a day for men. Know how much alcohol is in your drink. In the [place] one drink equals one 12 oz bottle of beer (355 mL), one 5 oz glass of wine (148 mL), or one 1 oz glass of hard liquor (44 mL). Keep yourself hydrated with water, diet soda, or unsweetened iced tea. Keep in mind that regular soda, juice, and other mixers may contain a lot of sugar and must be counted as carbs. What are tips for following this plan? Reading food labels Start by checking the serving size on the Nutrition Facts label of packaged foods and drinks. The number of calories and the amount of carbs, fats, and other nutrients listed on the label are based on one serving of the item. Many items contain more than  one serving per package. Check the total grams (g) of carbs in one serving. Check the number of grams of saturated fats and trans fats in one serving. Choose foods that have a low amount or none of these fats. Check the number of milligrams (mg) of salt (sodium) in one serving. Most people should limit total sodium intake to less than 2,300 mg per day. Always check the nutrition information of foods  labeled as "low-fat" or "nonfat." These foods may be higher in added sugar or refined carbs and should be avoided. Talk to your dietitian to identify your daily goals for nutrients listed on the label. Shopping Avoid buying canned, pre-made, or processed foods. These foods tend to be high in fat, sodium, and added sugar. Shop around the outside edge of the grocery store. This is where you will most often find fresh fruits and vegetables, bulk grains, fresh meats, and fresh dairy products. Cooking Use low-heat cooking methods, such as baking, instead of high-heat cooking methods, such as deep frying. Cook using healthy oils, such as olive, canola, or sunflower oil. Avoid cooking with butter, cream, or high-fat meats. Meal planning Eat meals and snacks regularly, preferably at the same times every day. Avoid going long periods of time without eating. Eat foods that are high in fiber, such as fresh fruits, vegetables, beans, and whole grains. Eat 4-6 oz (112-168 g) of lean protein each day, such as lean meat, chicken, fish, eggs, or tofu. One ounce (oz) (28 g) of lean protein is equal to: 1 oz (28 g) of meat, chicken, or fish. 1 egg.  cup (62 g) of tofu. Eat some foods each day that contain healthy fats, such as avocado, nuts, seeds, and fish. What foods should I eat? Fruits Berries. Apples. Oranges. Peaches. Apricots. Plums. Grapes. Mangoes. Papayas. Pomegranates. Kiwi. Cherries. Vegetables Leafy greens, including lettuce, spinach, kale, chard, collard greens, mustard greens, and cabbage. Beets. Cauliflower. Broccoli. Carrots. Green beans. Tomatoes. Peppers. Onions. Cucumbers. Brussels sprouts. Grains Whole grains, such as whole-wheat or whole-grain bread, crackers, tortillas, cereal, and pasta. Unsweetened oatmeal. Quinoa. Brown or wild rice. Meats and other proteins Seafood. Poultry without skin. Lean cuts of poultry and beef. Tofu. Nuts. Seeds. Dairy Low-fat or fat-free dairy products  such as milk, yogurt, and cheese. The items listed above may not be a complete list of foods and beverages you can eat and drink. Contact a dietitian for more information. What foods should I avoid? Fruits Fruits canned with syrup. Vegetables Canned vegetables. Frozen vegetables with butter or cream sauce. Grains Refined white flour and flour products such as bread, pasta, snack foods, and cereals. Avoid all processed foods. Meats and other proteins Fatty cuts of meat. Poultry with skin. Breaded or fried meats. Processed meat. Avoid saturated fats. Dairy Full-fat yogurt, cheese, or milk. Beverages Sweetened drinks, such as soda or iced tea. The items listed above may not be a complete list of foods and beverages you should avoid. Contact a dietitian for more information. Questions to ask a health care provider Do I need to meet with a certified diabetes care and education specialist? Do I need to meet with a dietitian? What number can I call if I have questions? When are the best times to check my blood glucose? Where to find more information: American Diabetes Association: diabetes.org Academy of Nutrition and Dietetics: eatright.[employer] of Diabetes and Digestive and Kidney Diseases: StageSync.si Association of Diabetes Care & Education Specialists: diabeteseducator.org Summary It is important to have healthy eating  habits because your blood sugar (glucose) levels are greatly affected by what you eat and drink. It is important to use alcohol carefully. A healthy meal plan will help you manage your blood glucose and lower your risk of heart disease. Your health care provider may recommend that you work with a dietitian to make a meal plan that is best for you. This information is not intended to replace advice given to you by your health care provider. Make sure you discuss any questions you have with your health care provider. Document Revised: 05/05/2020 Document  Reviewed: 05/05/2020 Elsevier Patient Education  2022 [employer]

## 2021-11-29 NOTE — Progress Notes (Signed)
ACUTE VISIT Chief Complaint  Patient presents with   GI Problem    X a few months, constant pain, no appetite. Nausea.    HPI: [name] is a 46 y.o. male with hx of DM II,HTN,and HLD here today complaining of "a few months" of lower abdominal pain and decreased appetite. Sharp pain, intensity 4-7/10.  Abdominal Pain This is a new problem. The current episode started more than 1 month ago. The onset quality is undetermined. The problem occurs constantly. The most recent episode lasted 24 hours. The problem has been unchanged. The pain is located in the suprapubic region, RLQ, LLQ and periumbilical region. The pain is at a severity of 7/10. The quality of the pain is sharp. The abdominal pain does not radiate. Associated symptoms include myalgias and nausea. Pertinent negatives include no belching, constipation, diarrhea, dysuria, fever, flatus, frequency, headaches, hematochezia, hematuria, melena or vomiting. Nothing aggravates the pain. The pain is relieved by Nothing. He has tried nothing for the symptoms. There is no history of abdominal surgery, pancreatitis, PUD or ulcerative colitis.  No associated bloating sensation or heartburn.  He has not identified exacerbating or alleviating factors. He has bowel movements every "couple days", sometimes he feels the urge but does not have a bowel movement. Stool is soft, no straining. He has not noted blood or mucus.  It is not affected by food intake. No alleviated by defecation. Nausea is intermittent.  He has been evaluated in the ED and antiemetic medication was prescribed, which helps. DM II on Trulicity, which was increased from 0.75 mg to 1.5 mg last visit, 11/16/21. Metformin 500 mg started last visit. Medication changes have not aggravate problem. Last visit, 11/16/2021, he was complaining of generalized pain, it is unchanged.  Lab Results  Component Value Date   CREATININE 0.93 11/16/2021   BUN 7 11/16/2021   NA 142  11/16/2021   K 4.2 11/16/2021   CL 106 11/16/2021   CO2 31 11/16/2021   Lab Results  Component Value Date   ALT 15 11/16/2021   AST 15 11/16/2021   ALKPHOS 62 11/16/2021   BILITOT 0.6 11/16/2021   Lab Results  Component Value Date   TSH 1.47 11/16/2021   Lab Results  Component Value Date   WBC 5.1 11/16/2021   HGB 15.5 11/16/2021   HCT 45.0 11/16/2021   MCV 91.9 11/16/2021   PLT 260.0 11/16/2021   Today mild tachycardia noted, he has not noted palpitations, CP, or dyspnea.  Review of Systems  Constitutional:  Negative for fever.  HENT:  Negative for mouth sores, sore throat and trouble swallowing.   Respiratory:  Negative for cough and wheezing.   Cardiovascular:  Negative for leg swelling.  Gastrointestinal:  Positive for abdominal pain and nausea. Negative for constipation, diarrhea, flatus, hematochezia, melena and vomiting.  Endocrine: Negative for cold intolerance, heat intolerance, polydipsia, polyphagia and polyuria.  Genitourinary:  Negative for dysuria, frequency and hematuria.  Musculoskeletal:  Positive for myalgias.  Neurological:  Negative for syncope, weakness and headaches.  Hematological:  Negative for adenopathy. Does not bruise/bleed easily.  Rest see pertinent positives and negatives per HPI.  Current Outpatient Medications on File Prior to Visit  Medication Sig Dispense Refill   atorvastatin (LIPITOR) 20 MG tablet Take 1 tablet (20 mg total) by mouth daily. 90 tablet 3   B-D INS SYR ULTRAFINE 1CC/30G 30G X 1/2" 1 ML MISC Inject 1 Device into the skin daily. 100 each 3   cholecalciferol (VITAMIN D3)  25 MCG (1000 UNIT) tablet Take 4,000 Units by mouth daily.     Continuous Blood Gluc Receiver (FREESTYLE LIBRE 14 DAY READER) DEVI Use as directed to test blood sugar 1 each 0   Continuous Blood Gluc Sensor (FREESTYLE LIBRE 14 DAY SENSOR) MISC USE TO TEST BLOOD SUGAR AS DIRECTED 1 each 3   Dulaglutide (TRULICITY) 1.5 0000000 SOPN Inject 1.5 mg into the  skin once a week. 2 mL 3   insulin glargine (LANTUS) 100 UNIT/ML injection Inject 0.2 mLs (20 Units total) into the skin at bedtime. 10 mL 3   metFORMIN (GLUCOPHAGE) 500 MG tablet 1 tab with breakfast and 2 tabs with supper. 180 tablet 1   No current facility-administered medications on file prior to visit.   Past Medical History:  Diagnosis Date   Diabetes mellitus without complication ([place])    Hyperlipidemia    Hypertension    No Known Allergies  Social History   Socioeconomic History   Marital status: Single    Spouse name: Not on file   Number of children: Not on file   Years of education: Not on file   Highest education level: Associate degree: occupational, Hotel manager, or vocational program  Occupational History   Not on file  Tobacco Use   Smoking status: Never   Smokeless tobacco: Never  Substance and Sexual Activity   Alcohol use: Not Currently   Drug use: Never   Sexual activity: Not Currently  Other Topics Concern   Not on file  Social History Narrative   Not on file   Social Determinants of Health   Financial Resource Strain: Low Risk    Difficulty of Paying Living Expenses: Not very hard  Food Insecurity: No Food Insecurity   Worried About Running Out of Food in the Last Year: Never true   [place] in the Last Year: Never true  Transportation Needs: No Transportation Needs   Lack of Transportation (Medical): No   Lack of Transportation (Non-Medical): No  Physical Activity: Sufficiently Active   Days of Exercise per Week: 5 days   Minutes of Exercise per Session: 150+ min  Stress: No Stress Concern Present   Feeling of Stress : Not at all  Social Connections: Unknown   Frequency of Communication with Friends and Family: Not on file   Frequency of Social Gatherings with Friends and Family: Not on file   Attends Religious Services: Not on file   Active Member of Clubs or Organizations: No   Attends Archivist Meetings: Not on file    Marital Status: Not on file   Vitals:   11/30/21 1412  BP: 120/80  Pulse: (!) 110  Resp: 16  SpO2: 98%   Body mass index is 32.91 kg/m.  Physical Exam Vitals and nursing note reviewed.  Constitutional:      General: He is not in acute distress.    Appearance: He is well-developed.  HENT:     Head: Normocephalic and atraumatic.     Mouth/Throat:     Mouth: Mucous membranes are moist.     Pharynx: Oropharynx is clear.  Eyes:     Conjunctiva/sclera: Conjunctivae normal.  Cardiovascular:     Rate and Rhythm: Regular rhythm. Tachycardia present.     Heart sounds: No murmur heard. Pulmonary:     Effort: Pulmonary effort is normal. No respiratory distress.     Breath sounds: Normal breath sounds.  Abdominal:     Palpations: Abdomen is soft. There is no  hepatomegaly or mass.     Tenderness: There is no abdominal tenderness.  Skin:    General: Skin is warm.     Findings: No erythema.  Neurological:     Mental Status: He is alert and oriented to person, place, and time.   ASSESSMENT AND PLAN:  [name] was seen today for gi problem.  Diagnoses and all orders for this visit: Orders Placed This Encounter  Procedures   Urinalysis, Routine w reflex microscopic   Ambulatory referral to Gastroenterology   Lower abdominal pain Pain was not reproducible with palpation today. Possible etiologies discussed. History and examination today do not suggest a serious process. He had blood work recently and otherwise normal, so I do not think blood work up is needed today. Instructed about warning signs. UA ordered today. He has not had colonoscopy done. GI referral placed. Instructed about warning signs.  Nausea without vomiting It has been persistent for a few months. He has medication prescribed during recent ER visit,not sure about name, I do not have copy of visit. No changes in current management. He does not think his medications are aggravating problem. He may need  EGD. GI evaluation will be arranged.  Mild tachycardia noted, instructed to monitor HR at home regularly and adequate hydration.  Return if symptoms worsen or fail to improve.  Eleazar Kimmey G. [place], MD  [facility]. [place] office.

## 2021-11-30 ENCOUNTER — Ambulatory Visit (INDEPENDENT_AMBULATORY_CARE_PROVIDER_SITE_OTHER): Payer: PRIVATE HEALTH INSURANCE | Admitting: Family Medicine

## 2021-11-30 ENCOUNTER — Encounter: Payer: Self-pay | Admitting: Family Medicine

## 2021-11-30 VITALS — BP 120/80 | HR 110 | Resp 16 | Ht 67.0 in | Wt 210.1 lb

## 2021-11-30 DIAGNOSIS — R1030 Lower abdominal pain, unspecified: Secondary | ICD-10-CM

## 2021-11-30 DIAGNOSIS — R110 Nausea: Secondary | ICD-10-CM | POA: Diagnosis not present

## 2021-11-30 LAB — URINALYSIS, ROUTINE W REFLEX MICROSCOPIC
Bilirubin Urine: NEGATIVE
Hgb urine dipstick: NEGATIVE
Ketones, ur: NEGATIVE
Leukocytes,Ua: NEGATIVE
Nitrite: NEGATIVE
Specific Gravity, Urine: >=1.03 — AB (ref 1.000–1.030)
Total Protein, Urine: NEGATIVE
Urine Glucose: NEGATIVE
Urobilinogen, UA: 0.2 (ref 0.0–1.0)
pH: 5.5 (ref 5.0–8.0)

## 2021-11-30 NOTE — Patient Instructions (Signed)
A few things to remember from today's visit:  Lower abdominal pain - Plan: Urinalysis, Routine w reflex microscopic, Ambulatory referral to Gastroenterology  If you need refills please call your pharmacy. Do not use My Chart to request refills or for acute issues that need immediate attention.   Please be sure medication list is accurate. If a new problem present, please set up appointment sooner than planned today.  Abdominal Pain, Adult Many things can cause belly (abdominal) pain. Most times, belly pain is not dangerous. Many cases of belly pain can be watched and treated at home. Sometimes, though, belly pain is serious. Your doctor will try to find the cause of your belly pain. Follow these instructions at home: Medicines Take over-the-counter and prescription medicines only as told by your doctor. Do not take medicines that help you poop (laxatives) unless told by your doctor. General instructions Watch your belly pain for any changes. Drink enough fluid to keep your pee (urine) pale yellow. Keep all follow-up visits as told by your doctor. This is important. Contact a doctor if: Your belly pain changes or gets worse. You are not hungry, or you lose weight without trying. You are having trouble pooping (constipated) or have watery poop (diarrhea) for more than 2-3 days. You have pain when you pee or poop. Your belly pain wakes you up at night. Your pain gets worse with meals, after eating, or with certain foods. You are vomiting and cannot keep anything down. You have a fever. You have blood in your pee. Get help right away if: Your pain does not go away as soon as your doctor says it should. You cannot stop vomiting. Your pain is only in areas of your belly, such as the right side or the left lower part of the belly. You have bloody or black poop, or poop that looks like tar. You have very bad pain, cramping, or bloating in your belly. You have signs of not having enough  fluid or water in your body (dehydration), such as: Dark pee, very little pee, or no pee. Cracked lips. Dry mouth. Sunken eyes. Sleepiness. Weakness. You have trouble breathing or chest pain. Summary Many cases of belly pain can be watched and treated at home. Watch your belly pain for any changes. Take over-the-counter and prescription medicines only as told by your doctor. Contact a doctor if your belly pain changes or gets worse. Get help right away if you have very bad pain, cramping, or bloating in your belly. This information is not intended to replace advice given to you by your health care provider. Make sure you discuss any questions you have with your health care provider. Document Revised: 02/10/2019 Document Reviewed: 02/10/2019 Elsevier Patient Education  2022 [employer].

## 2021-12-09 ENCOUNTER — Encounter: Payer: Self-pay | Admitting: Nurse Practitioner

## 2021-12-20 ENCOUNTER — Other Ambulatory Visit (INDEPENDENT_AMBULATORY_CARE_PROVIDER_SITE_OTHER): Payer: PRIVATE HEALTH INSURANCE

## 2021-12-20 ENCOUNTER — Encounter: Payer: Self-pay | Admitting: Physician Assistant

## 2021-12-20 ENCOUNTER — Ambulatory Visit (INDEPENDENT_AMBULATORY_CARE_PROVIDER_SITE_OTHER): Payer: PRIVATE HEALTH INSURANCE | Admitting: Physician Assistant

## 2021-12-20 VITALS — BP 126/80 | HR 116 | Ht 66.0 in | Wt 199.8 lb

## 2021-12-20 DIAGNOSIS — R634 Abnormal weight loss: Secondary | ICD-10-CM

## 2021-12-20 DIAGNOSIS — R1033 Periumbilical pain: Secondary | ICD-10-CM

## 2021-12-20 DIAGNOSIS — R1030 Lower abdominal pain, unspecified: Secondary | ICD-10-CM

## 2021-12-20 LAB — BASIC METABOLIC PANEL
BUN: 10 mg/dL (ref 6–23)
CO2: 29 mEq/L (ref 19–32)
Calcium: 9.9 mg/dL (ref 8.4–10.5)
Chloride: 106 mEq/L (ref 96–112)
Creatinine, Ser: 1.03 mg/dL (ref 0.40–1.50)
GFR: 87.61 mL/min (ref >60.00)
Glucose, Bld: 112 mg/dL — ABNORMAL HIGH (ref 70–99)
Potassium: 4.1 mEq/L (ref 3.5–5.1)
Sodium: 142 mEq/L (ref 135–145)

## 2021-12-20 LAB — C-REACTIVE PROTEIN: CRP: <1 mg/dL (ref 0.5–20.0)

## 2021-12-20 LAB — SEDIMENTATION RATE: Sed Rate: 5 mm/hr (ref 0–15)

## 2021-12-20 MED ORDER — PANTOPRAZOLE SODIUM 40 MG PO TBEC: 40.0000 mg | DELAYED_RELEASE_TABLET | Freq: Every day | ORAL | 3 refills | Status: DC

## 2021-12-20 MED ORDER — ONDANSETRON HCL 4 MG PO TABS: 4.0000 mg | ORAL_TABLET | Freq: Four times a day (QID) | ORAL | 1 refills | Status: DC | PRN

## 2021-12-20 NOTE — Progress Notes (Signed)
? ?Subjective:  ? ? Patient ID: Austin Fuller, male    DOB: [birth year], 46 y.o.   MRN: [id number] ? ?HPI ?Austin Fuller is a pleasant 46 year old white male, new to GI today referred by Dr. Betty Martinique for evaluation of lower abdominal pain and weight loss. ?Patient has not had any prior GI evaluation.  He does have history of hypertension, adult onset diabetes mellitus, and obesity. ?He says his current symptoms started about 3 months ago initially noticing a decrease in appetite around Christmas time which has persisted.  After that he began noticing lower abdominal discomfort after eating and a sense of bloating and discomfort.  He says his abdomen hurts in the mid abdomen, below the umbilicus and radiates across, no back pain.  He has had a total of about 17 pound weight loss since onset of symptoms.  He has had queasiness but no vomiting, no complaints of heartburn or indigestion, no dysphagia or odynophagia.  He does have mild issues with constipation , has not noticed any melena or hematochezia.  No fevers or chills. ?He says the pain comes and goes but is present throughout most of the day, rates it as a 4 out of 10 at its worst, and sharp and cramping in nature.  He relates that he has an umbilical hernia which has been present for a long time and has not bothered him.  He does not feel the pain is coming from the umbilical hernia. ?Most recent labs done 11/16/2021 with normal CBC, TSH 1.47, A1c 7.8 and c-Met unremarkable. ?No recent abdominal imaging ?Recent UA negative. ?He is also describing rather generalized aching and pain, and has been taking aspirin twice daily ? ?He has been on Trulicity for around of year, he did have dose increase but thinks that his symptoms started prior to that, he has also been on a metformin containing product over the past couple of years. ? ?Family history negative for GI disease as far as he is aware. ? ?Review of Systems Pertinent positive and negative review of  systems were noted in the above HPI section.  All other review of systems was otherwise negative.  ? ?Outpatient Encounter Medications as of 12/20/2021  ?Medication Sig  ? B-D INS SYR ULTRAFINE 1CC/30G 30G X 1/2" 1 ML MISC Inject 1 Device into the skin daily.  ? cholecalciferol (VITAMIN D3) 25 MCG (1000 UNIT) tablet Take 4,000 Units by mouth daily.  ? Continuous Blood Gluc Receiver (FREESTYLE LIBRE 14 DAY READER) DEVI Use as directed to test blood sugar  ? Continuous Blood Gluc Sensor (FREESTYLE LIBRE 14 DAY SENSOR) MISC USE TO TEST BLOOD SUGAR AS DIRECTED  ? Dulaglutide (TRULICITY) 1.5 PI/9.5JO SOPN Inject 1.5 mg into the skin once a week.  ? insulin glargine (LANTUS) 100 UNIT/ML injection Inject 0.2 mLs (20 Units total) into the skin at bedtime.  ? metFORMIN (GLUCOPHAGE) 500 MG tablet 1 tab with breakfast and 2 tabs with supper.  ? ondansetron (ZOFRAN) 4 MG tablet Take 1 tablet (4 mg total) by mouth every 6 (six) hours as needed for nausea or vomiting.  ? pantoprazole (PROTONIX) 40 MG tablet Take 1 tablet (40 mg total) by mouth daily.  ? [DISCONTINUED] atorvastatin (LIPITOR) 20 MG tablet Take 1 tablet (20 mg total) by mouth daily.  ? ?No facility-administered encounter medications on file as of 12/20/2021.  ? ?No Known Allergies ?Patient Active Problem List  ? Diagnosis Date Noted  ? Type 2 diabetes mellitus with other specified complication ([place]) 84/16/6063  ?  Hyperlipidemia associated with type 2 diabetes mellitus ([place]) 12/09/2019  ? Hypertension, essential, benign 12/09/2019  ? Class 2 obesity with body mass index (BMI) of 36.0 to 36.9 in adult 12/09/2019  ? ?Social History  ? ?Socioeconomic History  ? Marital status: Single  ?  Spouse name: Not on file  ? Number of children: Not on file  ? Years of education: Not on file  ? Highest education level: Associate degree: occupational, Hotel manager, or vocational program  ?Occupational History  ? Not on file  ?Tobacco Use  ? Smoking status: Never  ? Smokeless tobacco:  Never  ?Substance and Sexual Activity  ? Alcohol use: Not Currently  ? Drug use: Never  ? Sexual activity: Not Currently  ?Other Topics Concern  ? Not on file  ?Social History Narrative  ? Not on file  ? ?Social Determinants of Health  ? ?Financial Resource Strain: Low Risk   ? Difficulty of Paying Living Expenses: Not very hard  ?Food Insecurity: No Food Insecurity  ? Worried About Charity fundraiser in the Last Year: Never true  ? Ran Out of Food in the Last Year: Never true  ?Transportation Needs: No Transportation Needs  ? Lack of Transportation (Medical): No  ? Lack of Transportation (Non-Medical): No  ?Physical Activity: Sufficiently Active  ? Days of Exercise per Week: 5 days  ? Minutes of Exercise per Session: 150+ min  ?Stress: No Stress Concern Present  ? Feeling of Stress : Not at all  ?Social Connections: Unknown  ? Frequency of Communication with Friends and Family: Not on file  ? Frequency of Social Gatherings with Friends and Family: Not on file  ? Attends Religious Services: Not on file  ? Active Member of Clubs or Organizations: No  ? Attends Archivist Meetings: Not on file  ? Marital Status: Not on file  ?Intimate Partner Violence: Not on file  ? ? ?Austin Fuller's family history includes Early death in his father and mother; Heart attack in his father and sister; Leukemia in his mother. ? ? ?   ?Objective:  ?  ?Vitals:  ? 12/20/21 0827  ?BP: 126/80  ?Pulse: (!) 116  ? ? ?Physical Exam Well-developed well-nourished WM  in no acute distress.  Height, Weight, 199 BMI 32 ? ?HEENT; nontraumatic normocephalic, EOMI, PE R LA, sclera anicteric. ?Oropharynx; not examined ?Neck; supple, no JVD ?Cardiovascular; regular rate and rhythm with S1-S2, no murmur rub or gallop ?Pulmonary; Clear bilaterally ?Abdomen; soft, nontender, nondistended, no palpable mass or hepatosplenomegaly, bowel sounds are active, medium sized umbilical hernia, partially reducible and nontender ?Rectal; not done  today ?Skin; benign exam, no jaundice rash or appreciable lesions ?Extremities; no clubbing cyanosis or edema skin warm and dry ?Neuro/Psych; alert and oriented x4, grossly nonfocal mood and affect appropriate  ? ? ? ?   ?Assessment & Plan:  ? ?#32 46 year old white male diabetic, on oral agents with 29-monthhistory of poor appetite, 17 pound weight loss, and lower abdominal pain/low mid abdomen worse postprandially and described as sharp and cramping. ? ?Etiology of symptoms is not clear, rule out occult neoplasm, rule out IBD, consider component of medication induced symptoms i.e. Trulicity and metformin though neither of these drugs is new. ? ?#2 adult onset diabetes mellitus ?#3  Hypertension ? ?Plan; patient will be scheduled for CT of the abdomen and pelvis with contrast. ?Check be met, sed rate and CRP ?Start Protonix 40 mg p.o. every morning AC breakfast, prescription sent ?Patient advised  to minimize aspirin use ?We will also send prescription for Zofran 4 mg every 6 hours as needed for nausea ?If CT is unrevealing, he will need colonoscopy and EGD with Dr. Lorenso Courier, with whom he will be established. ? ?Alfredia Ferguson PA-C ?12/20/2021 ? ? ?Cc: Martinique, Betty G, MD ?  ?

## 2021-12-20 NOTE — Patient Instructions (Signed)
Your provider has requested that you go to the basement level for lab work before leaving today. Press "B" on the elevator. The lab is located at the first door on the left as you exit the elevator. ? ?We have sent the following medications to your pharmacy for you to pick up at your convenience: ?Pantoprazole 40 mg daily 30-60 minutes before breakfast. ?Zofran 4 mg ODT every 6 hours as needed for nausea. ? ?You have been scheduled for a CT scan of the abdomen and pelvis at [facility] ([address]---this is in the same building as [employer]).  ? ?You are scheduled on Tuesday 12/27/21 at 11:30 am. You should arrive 15 minutes prior to your appointment time for registration. Please follow the written instructions below on the day of your exam: ? ?WARNING: IF YOU ARE ALLERGIC TO IODINE/X-RAY DYE, PLEASE NOTIFY RADIOLOGY IMMEDIATELY AT [phone]! YOU WILL BE GIVEN A 13 HOUR PREMEDICATION PREP. ? ?1) Do not eat anything after 7:30 am (4 hours prior to your test) ?2) You have been given 2 bottles of oral contrast to drink. The solution may taste better if refrigerated, but do NOT add ice or any other liquid to this solution. Shake well before drinking. ?  ? Drink 1 bottle of contrast @ 9:30 am (2 hours prior to your exam) ? Drink 1 bottle of contrast @ 10:30 am (1 hour prior to your exam) ? ?You may take any medications as prescribed with a small amount of water, if necessary. If you take any of the following medications: METFORMIN, GLUCOPHAGE, GLUCOVANCE, AVANDAMET, RIOMET, FORTAMET, ACTOPLUS MET, JANUMET, [place] or METAGLIP, you MAY be asked to HOLD this medication 48 hours AFTER the exam. ? ?The purpose of you drinking the oral contrast is to aid in the visualization of your intestinal tract. The contrast solution may cause some diarrhea. Depending on your individual set of symptoms, you may also receive an intravenous injection of x-ray contrast/dye. Plan on being at [facility] for 30 minutes or longer, depending on the type of exam you are having performed. ? ?This test typically takes 30-45 minutes to complete. ? ?If you have any questions regarding your exam or if you need to reschedule, you may call the CT department at [phone] between the hours of 8:00 am and 5:00 pm, Monday-Friday. ? ?____________________________________________________________________ ? ?

## 2021-12-21 NOTE — Progress Notes (Signed)
I agree with the assessment and plan as outlined by Ms. Esterwood. 

## 2021-12-27 ENCOUNTER — Ambulatory Visit (INDEPENDENT_AMBULATORY_CARE_PROVIDER_SITE_OTHER)
Admission: RE | Admit: 2021-12-27 | Discharge: 2021-12-27 | Disposition: A | Discharge status: Home / Self Care | Payer: PRIVATE HEALTH INSURANCE | Source: Ambulatory Visit | Attending: Physician Assistant | Admitting: Physician Assistant

## 2021-12-27 ENCOUNTER — Other Ambulatory Visit: Payer: Self-pay

## 2021-12-27 DIAGNOSIS — R634 Abnormal weight loss: Secondary | ICD-10-CM

## 2021-12-27 DIAGNOSIS — R1030 Lower abdominal pain, unspecified: Secondary | ICD-10-CM | POA: Diagnosis not present

## 2021-12-27 DIAGNOSIS — R1033 Periumbilical pain: Secondary | ICD-10-CM

## 2021-12-27 MED ORDER — IOHEXOL 300 MG/ML  SOLN
100.0000 mL | Freq: Once | INTRAMUSCULAR | Status: AC | PRN
  Administered 2021-12-27: 100 mL via INTRAVENOUS

## 2021-12-28 NOTE — Progress Notes (Signed)
Called and left a voicemail. Sent a detailed message through My Chart. ?April 19 in [facility] held for the patient.

## 2022-01-23 ENCOUNTER — Telehealth: Payer: Self-pay | Admitting: Family Medicine

## 2022-01-23 MED ORDER — TRULICITY 1.5 MG/0.5ML ~~LOC~~ SOAJ: 1.5000 mg | SUBCUTANEOUS | 3 refills | Status: DC

## 2022-01-23 NOTE — Telephone Encounter (Signed)
Refill needed on Trulicity 1.5 mgs ?

## 2022-01-30 ENCOUNTER — Telehealth: Payer: Self-pay | Admitting: Internal Medicine

## 2022-01-30 NOTE — Telephone Encounter (Signed)
Spoke with the patient. He does not have a care partner. His family does not live in the area. He will have to cancel and reschedule to a later date. He will call back when he has this issued resolved. ?

## 2022-01-30 NOTE — Telephone Encounter (Signed)
Patient called to inquire about EGD/Colonoscopy scheduled 02/01/22. Per patient, he never received prep instructions. Please advise. ?

## 2022-02-01 ENCOUNTER — Encounter: Payer: PRIVATE HEALTH INSURANCE | Admitting: Internal Medicine

## 2022-02-02 ENCOUNTER — Telehealth: Payer: Self-pay | Admitting: Physician Assistant

## 2022-02-02 ENCOUNTER — Other Ambulatory Visit: Payer: Self-pay

## 2022-02-02 ENCOUNTER — Telehealth: Payer: Self-pay | Admitting: Nurse Practitioner

## 2022-02-02 MED ORDER — GOLYTELY 236 G PO SOLR: ORAL | 0 refills | Status: DC

## 2022-02-02 NOTE — Telephone Encounter (Signed)
Instructions printed and mailed to the patient

## 2022-02-02 NOTE — Telephone Encounter (Signed)
error 

## 2022-02-02 NOTE — Telephone Encounter (Signed)
Patient called today to reschedule his endo/colon with Dr. Leonides Schanz.  New date & time:  03/14/22 at 11:00 a.m.  Patient said he needs instructions as well as prescription for prep sent to his pharmacy.  Thank you. ?

## 2022-02-03 NOTE — Progress Notes (Signed)
? ?ACUTE VISIT ?Chief Complaint  ?Patient presents with  ? Leg Pain  ?  & body pain, ongoing x months. Has gotten worse as time has gone on.  ? Cough  ?  Thinks it might be a lingering effect of covid.  ? Insomnia  ?  X at least a year, usually is able to go back to sleep, but lately has been only getting about 3 hours of sleep.  ? Constipation  ?  Having issues going to the bathroom, able to urinate.  ? ?HPI: ?Austin Fuller is a 46 y.o. male with history of DM 2, hypertension, and hyperlipidemia here today with above complaints. ?He was seen on 11/30/21 when he was c/o abdominal pain, abdominal CT was ordered.  This problem has improved and following with GI, has appt for colonoscopy. ? ?Constipation ?This is a recurrent problem. The current episode started more than 1 month ago. The problem is unchanged. His stool frequency is 1 time per week or less. The stool is described as formed. The patient is on a high fiber diet. He Does not exercise regularly. There has Been adequate water intake. Associated symptoms include weight loss. Pertinent negatives include no bloating, diarrhea, difficulty urinating, fecal incontinence, fever, flatus, hematochezia, hemorrhoids, melena, nausea, rectal pain or vomiting. Risk factors include obesity. He has tried laxatives for the symptoms. The treatment provided no relief. His past medical history is significant for metabolic disease. There is no history of inflammatory bowel disease, neuromuscular disease or psychiatric history.  ?He usually has a bowel movement weekly, last 1 today and small. ?He takes metformin and Trulicity for diabetes mellitus type 2. ?Abdominal CT done on 12/27/2021 was negative for abdominal or pelvic findings.  Moderate sized fat-containing umbilical hernia and a small bilateral fat-containing inguinal hernias.  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 ? ?Pain "all over", which he  reported on 11/16/21, myalgias and arthralgias.Negative for joint edema/erythema, numbness, or tingling. ?Pain is constant achy like pain, now 1-2/10 but last night was 6-7/10. ?LE pain mainly at night, no edema or erythema. Sometimes he feels the urge of moving legs to relieve discomfort. ?No hx of trauma. ?Blood work done last visit was otherwise negative. ?Negative for unusual physical activity. ? ?Lab Results  ?Component Value Date  ? ESRSEDRATE 5 12/20/2021  ? ?Lab Results  ?Component Value Date  ? CRP <1.0 12/20/2021  ? ?Lab Results  ?Component Value Date  ? CREATININE 1.03 12/20/2021  ? BUN 10 12/20/2021  ? NA 142 12/20/2021  ? K 4.1 12/20/2021  ? CL 106 12/20/2021  ? CO2 29 12/20/2021  ? ?Lab Results  ?Component Value Date  ? WBC 5.1 11/16/2021  ? HGB 15.5 11/16/2021  ? HCT 45.0 11/16/2021  ? MCV 91.9 11/16/2021  ? PLT 260.0 11/16/2021  ? ?Insomnia: He states that problem has been going on since 08/2021 and getting worse. ?He has tried different OTC sleep aids, unsuccessful. ?Sleeps 3-5 hours at the time and wakes up frequently through the nigh. ?He has not identified exacerbating or alleviating factors. ? ?Denies high alcohol intake. ? ? ?  02/06/2022  ?  9:31 AM 11/16/2021  ?  9:14 AM 07/04/2021  ? 10:30 PM 02/09/2020  ?  9:21 AM  ?Depression screen PHQ 2/9  ?Decreased Interest 1 0 0 0  ?Down, Depressed, Hopeless 1 0 0 0  ?PHQ - 2 Score 2 0 0 0  ?Altered  sleeping 3     ?Tired, decreased energy 3     ?Change in appetite 3     ?Feeling bad or failure about yourself  0     ?Trouble concentrating 0     ?Moving slowly or fidgety/restless 0     ?Suicidal thoughts 0     ?PHQ-9 Score 11     ?Difficult doing work/chores Not difficult at all     ? ?"Little" depressed but because he is not sleeping and not feeling well.States that May and June are hard months for him because his mother and father death around these months, a year apart. ? ?Cough since 09/2021 when he had COVID 19 infection. Non productive and no associated  wheezing or SOB. ?Negative for wheezing, dyspnea, or heartburn. ?GERD on Protonix 40 mg daily. ? ?Review of Systems  ?Constitutional:  Positive for fatigue and weight loss. Negative for activity change, appetite change and fever.  ?HENT:  Negative for mouth sores, sore throat and trouble swallowing.   ?Cardiovascular:  Negative for chest pain and palpitations.  ?Gastrointestinal:  Positive for constipation. Negative for bloating, diarrhea, flatus, hematochezia, hemorrhoids, melena, nausea, rectal pain and vomiting.  ?Endocrine: Negative for cold intolerance and heat intolerance.  ?Genitourinary:  Negative for decreased urine volume, difficulty urinating, dysuria and hematuria.  ?Musculoskeletal:  Negative for gait problem.  ?Skin:  Negative for rash.  ?Neurological:  Negative for syncope and weakness.  ?Rest see pertinent positives and negatives per HPI. ? ?Current Outpatient Medications on File Prior to Visit  ?Medication Sig Dispense Refill  ? B-D INS SYR ULTRAFINE 1CC/30G 30G X 1/2" 1 ML MISC Inject 1 Device into the skin daily. 100 each 3  ? cholecalciferol (VITAMIN D3) 25 MCG (1000 UNIT) tablet Take 4,000 Units by mouth daily.    ? Continuous Blood Gluc Receiver (FREESTYLE LIBRE 14 DAY READER) DEVI Use as directed to test blood sugar 1 each 0  ? Continuous Blood Gluc Sensor (FREESTYLE LIBRE 14 DAY SENSOR) MISC USE TO TEST BLOOD SUGAR AS DIRECTED 1 each 3  ? Dulaglutide (TRULICITY) 1.5 MG/0.5ML SOPN Inject 1.5 mg into the skin once a week. 2 mL 3  ? insulin glargine (LANTUS) 100 UNIT/ML injection Inject 0.2 mLs (20 Units total) into the skin at bedtime. 10 mL 3  ? metFORMIN (GLUCOPHAGE) 500 MG tablet 1 tab with breakfast and 2 tabs with supper. 180 tablet 1  ? ondansetron (ZOFRAN) 4 MG tablet Take 1 tablet (4 mg total) by mouth every 6 (six) hours as needed for nausea or vomiting. 40 tablet 1  ? pantoprazole (PROTONIX) 40 MG tablet Take 1 tablet (40 mg total) by mouth daily. 30 tablet 3  ? polyethylene glycol  (GOLYTELY) 236 g solution Use twice following the doctor instruction 4000 mL 0  ? ?No current facility-administered medications on file prior to visit.  ? ?Past Medical History:  ?Diagnosis Date  ? Diabetes mellitus without complication (H[facility])   ? Hyperlipidemia   ? Hypertension   ? ?No Known Allergies ? ?Social History  ? ?Socioeconomic History  ? Marital status: Single  ?  Spouse name: Not on file  ? Number of children: Not on file  ? Years of education: Not on file  ? Highest education level: Associate degree: occupational, Scientist, product/process development, or vocational program  ?Occupational History  ? Not on file  ?Tobacco Use  ? Smoking status: Never  ? Smokeless tobacco: Never  ?Substance and Sexual Activity  ? Alcohol use: Not Currently  ?  Drug use: Never  ? Sexual activity: Not Currently  ?Other Topics Concern  ? Not on file  ?Social History Narrative  ? Not on file  ? ?Social Determinants of Health  ? ?Financial Resource Strain: Low Risk   ? Difficulty of Paying Living Expenses: Not very hard  ?Food Insecurity: No Food Insecurity  ? Worried About Programme researcher, broadcasting/film/videounning Out of Food in the Last Year: Never true  ? Ran Out of Food in the Last Year: Never true  ?Transportation Needs: No Transportation Needs  ? Lack of Transportation (Medical): No  ? Lack of Transportation (Non-Medical): No  ?Physical Activity: Sufficiently Active  ? Days of Exercise per Week: 5 days  ? Minutes of Exercise per Session: 150+ min  ?Stress: No Stress Concern Present  ? Feeling of Stress : Not at all  ?Social Connections: Unknown  ? Frequency of Communication with Friends and Family: Not on file  ? Frequency of Social Gatherings with Friends and Family: Not on file  ? Attends Religious Services: Not on file  ? Active Member of Clubs or Organizations: No  ? Attends BankerClub or Organization Meetings: Not on file  ? Marital Status: Not on file  ? ?Vitals:  ? 02/06/22 0922  ?BP: 120/80  ?Pulse: (!) 106  ?Resp: 16  ?Temp: 98.4 ?F (36.9 ?C)  ?SpO2: 98%  ? ?Wt Readings from Last  3 Encounters:  ?02/06/22 197 lb 4 oz (89.5 kg)  ?12/20/21 199 lb 12.8 oz (90.6 kg)  ?11/30/21 210 lb 2 oz (95.3 kg)  ? ?Body mass index is 31.84 kg/m?. ? ?Physical Exam ?Vitals and nursing note reviewed.

## 2022-02-06 ENCOUNTER — Encounter: Payer: Self-pay | Admitting: Family Medicine

## 2022-02-06 ENCOUNTER — Ambulatory Visit (INDEPENDENT_AMBULATORY_CARE_PROVIDER_SITE_OTHER): Payer: PRIVATE HEALTH INSURANCE | Admitting: Family Medicine

## 2022-02-06 VITALS — BP 120/80 | HR 106 | Temp 98.4°F | Resp 16 | Ht 66.0 in | Wt 197.2 lb

## 2022-02-06 DIAGNOSIS — K5900 Constipation, unspecified: Secondary | ICD-10-CM

## 2022-02-06 DIAGNOSIS — M79604 Pain in right leg: Secondary | ICD-10-CM | POA: Diagnosis not present

## 2022-02-06 DIAGNOSIS — M79605 Pain in left leg: Secondary | ICD-10-CM

## 2022-02-06 DIAGNOSIS — G4700 Insomnia, unspecified: Secondary | ICD-10-CM | POA: Diagnosis not present

## 2022-02-06 DIAGNOSIS — M797 Fibromyalgia: Secondary | ICD-10-CM | POA: Diagnosis not present

## 2022-02-06 DIAGNOSIS — R053 Chronic cough: Secondary | ICD-10-CM

## 2022-02-06 MED ORDER — GABAPENTIN 100 MG PO CAPS: 100.0000 mg | ORAL_CAPSULE | Freq: Three times a day (TID) | ORAL | 2 refills | Status: DC

## 2022-02-06 NOTE — Assessment & Plan Note (Signed)
In 11/2021 blood work otherwise negative. ?Discussed diagnostic criteria for fibromyalgia, prognosis, and treatment options. ?After discussion of some side effects, he agrees with trying gabapentin. ?He will start gabapentin 100 mg at bedtime and titrate dose up to 100 mg 3 times daily. ?Adequate sleep hygiene and low impact exercise recommended. ? ?

## 2022-02-06 NOTE — Assessment & Plan Note (Signed)
We discussed possible etiologies. ?He does not think depression and anxiety are causing problem. ?Stressed the importance of a good sleep hygiene. ?Gabapentin started today, it may help. ?

## 2022-02-06 NOTE — Patient Instructions (Addendum)
A few things to remember from today's visit: ? ?Fibromyalgia - Plan: gabapentin (NEURONTIN) 100 MG capsule ? ?Insomnia, unspecified type - Plan: gabapentin (NEURONTIN) 100 MG capsule ? ?Constipation, unspecified constipation type ? ?If you need refills please call your pharmacy. ?Do not use My Chart to request refills or for acute issues that need immediate attention. ?  ?Gabapentin started today, start taking it at bedtime. In 7-10 days add a dose in the morning and in 10 more days one at noon to continue 3 times daily. ?Good sleep hygiene. ? ?Please be sure medication list is accurate. ?If a new problem present, please set up appointment sooner than planned today. ? ?Miralax at night and if still having problems with bowel movements, add Bisacodyl 5 mg every other day at night. ?Adequate fiber and fluid intake. ?Keep appt with gastro. ? ?Myofascial Pain Syndrome and Fibromyalgia ?Myofascial pain syndrome and fibromyalgia are both pain disorders. You may feel this pain mainly in your muscles. ?Myofascial pain syndrome: ?Always has tender points in the muscles that will cause pain when pressed (trigger points). The pain may come and go. ?Usually affects your neck, upper back, and shoulder areas. The pain often moves into your arms and hands. ?Fibromyalgia: ?Has muscle pains and tenderness that come and go. ?Is often associated with tiredness (fatigue) and sleep problems. ?Has trigger points. ?Tends to be long-lasting (chronic), but is not life-threatening. ?Fibromyalgia and myofascial pain syndrome are not the same. However, they often occur together. If you have both conditions, each can make the other worse. Both are common and can cause enough pain and fatigue to make day-to-day activities difficult. Both can be hard to diagnose because their symptoms are common in many other conditions. ?What are the causes? ?The exact causes of these conditions are not known. ?What increases the risk? ?You are more likely to  develop either of these conditions if: ?You have a family history of the condition. ?You are male. ?You have certain triggers, such as: ?Spine disorders. ?An injury (trauma) or other physical stressors. ?Being under a lot of stress. ?Medical conditions such as osteoarthritis, rheumatoid arthritis, or lupus. ?What are the signs or symptoms? ?Fibromyalgia ?The main symptom of fibromyalgia is widespread pain and tenderness in your muscles. Pain is sometimes described as stabbing, shooting, or burning. ?You may also have: ?Tingling or numbness. ?Sleep problems and fatigue. ?Problems with attention and concentration (fibro fog). ?Other symptoms may include: ?Bowel and bladder problems. ?Headaches. ?Vision problems. ?Sensitivity to odors and noises. ?Depression or mood changes. ?Painful menstrual periods (dysmenorrhea). ?Dry skin or eyes. ?These symptoms can vary over time. ?Myofascial pain syndrome ?Symptoms of myofascial pain syndrome include: ?Tight, ropy bands of muscle. ?Uncomfortable sensations in muscle areas. These may include aching, cramping, burning, numbness, tingling, and weakness. ?Difficulty moving certain parts of the body freely (poor range of motion). ?How is this diagnosed? ?This condition may be diagnosed by your symptoms and medical history. You will also have a physical exam. In general: ?Fibromyalgia is diagnosed if you have pain, fatigue, and other symptoms for more than 3 months, and symptoms cannot be explained by another condition. ?Myofascial pain syndrome is diagnosed if you have trigger points in your muscles, and those trigger points are tender and cause pain elsewhere in your body (referred pain). ?How is this treated? ?Treatment for these conditions depends on the type that you have. ?For fibromyalgia a healthy lifestyle is the most important treatment including aerobic and strength exercises. Different types of medicines are used  to help treat pain and include: ?NSAIDs. ?Medicines for  treating depression. ?Medicines that help control seizures. ?Medicines that relax the muscles. ?Treatment for myofascial pain syndrome includes: ?Pain medicines, such as NSAIDs. ?Cooling and stretching of muscles. ?Massage therapy with myofascial release technique. ?Trigger point injections. ?Treating these conditions often requires a team of health care providers. These may include: ?Your primary care provider. ?A physical therapist. ?Complementary health care providers, such as massage therapists or acupuncturists. ?A psychiatrist for cognitive behavioral therapy. ?Follow these instructions at home: ?Medicines ?Take over-the-counter and prescription medicines only as told by your health care provider. ?Ask your health care provider if the medicine prescribed to you: ?Requires you to avoid driving or using machinery. ?Can cause constipation. You may need to take these actions to prevent or treat constipation: ?Drink enough fluid to keep your urine pale yellow. ?Take over-the-counter or prescription medicines. ?Eat foods that are high in fiber, such as beans, whole grains, and fresh fruits and vegetables. ?Limit foods that are high in fat and processed sugars, such as fried or sweet foods. ?Lifestyle ? ?Do exercises as told by your health care provider or physical therapist. ?Practice relaxation techniques to control your stress. You may want to try: ?Biofeedback. ?Visual imagery. ?Hypnosis. ?Muscle relaxation. ?Yoga. ?Meditation. ?Maintain a healthy lifestyle. This includes eating a healthy diet and getting enough sleep. ?Do not use any products that contain nicotine or tobacco. These products include cigarettes, chewing tobacco, and vaping devices, such as e-cigarettes. If you need help quitting, ask your health care provider. ?General instructions ?Talk to your health care provider about complementary treatments, such as acupuncture or massage. ?Do not do activities that stress or strain your muscles. This  includes repetitive motions and heavy lifting. ?Keep all follow-up visits. This is important. ?Where to find support ?Consider joining a support group with others who are diagnosed with this condition. ?National Fibromyalgia Association: www.fmaware.org ?Where to find more information ?American Chronic Pain Association: www.theacpa.org ?Contact a health care provider if: ?You have new symptoms. ?Your symptoms get worse or your pain is severe. ?You have side effects from your medicines. ?You have trouble sleeping. ?Your condition is causing depression or anxiety. ?Get help right away if: ?You have thoughts of hurting yourself or others. ?Get help right awayif you feel like you may hurt yourself or others, or have thoughts about taking your own life. Go to your nearest emergency room or: ?Call 911. ?Call the [facility] at [phone] or [phone]. This is open 24 hours a day. ?Text the Crisis Text Line at 403-135-5573. ?Summary ?Myofascial pain syndrome and fibromyalgia are pain disorders. ?Myofascial pain syndrome has tender points in the muscles that will cause pain when pressed (trigger points). Fibromyalgia also has muscle pains and tenderness that come and go, but this condition is often associated with fatigue and sleep disturbances. ?Fibromyalgia and myofascial pain syndrome are not the same but often occur together, causing pain and fatigue that make day-to-day activities difficult. ?Follow your health care provider's instructions for taking medicines and maintaining a healthy lifestyle. ?This information is not intended to replace advice given to you by your health care provider. Make sure you discuss any questions you have with your health care provider. ?Document Revised: 09/02/2021 Document Reviewed: 09/02/2021 ?Elsevier Patient Education ? [place]. ? ? ? ? ?

## 2022-02-06 NOTE — Assessment & Plan Note (Signed)
Continue adequate water and fiber intake. ?Recommend MiraLAX at bedtime, if he still having problem within a week of starting it, he can add bisacodyl 5 mg every other day. ?If OTC medications do not help, we can consider prescription Linzess or Amitiza. ?Keep appointment with GI. ?

## 2022-03-14 ENCOUNTER — Ambulatory Visit (AMBULATORY_SURGERY_CENTER): Payer: PRIVATE HEALTH INSURANCE | Admitting: Internal Medicine

## 2022-03-14 ENCOUNTER — Encounter: Payer: Self-pay | Admitting: Internal Medicine

## 2022-03-14 VITALS — BP 131/89 | HR 82 | Temp 98.0°F | Resp 20 | Ht 66.0 in | Wt 199.0 lb

## 2022-03-14 DIAGNOSIS — K2970 Gastritis, unspecified, without bleeding: Secondary | ICD-10-CM

## 2022-03-14 DIAGNOSIS — R1030 Lower abdominal pain, unspecified: Secondary | ICD-10-CM

## 2022-03-14 DIAGNOSIS — K317 Polyp of stomach and duodenum: Secondary | ICD-10-CM | POA: Diagnosis not present

## 2022-03-14 DIAGNOSIS — R634 Abnormal weight loss: Secondary | ICD-10-CM | POA: Diagnosis present

## 2022-03-14 DIAGNOSIS — K2980 Duodenitis without bleeding: Secondary | ICD-10-CM

## 2022-03-14 DIAGNOSIS — K2100 Gastro-esophageal reflux disease with esophagitis, without bleeding: Secondary | ICD-10-CM

## 2022-03-14 DIAGNOSIS — K319 Disease of stomach and duodenum, unspecified: Secondary | ICD-10-CM | POA: Diagnosis not present

## 2022-03-14 DIAGNOSIS — Z538 Procedure and treatment not carried out for other reasons: Secondary | ICD-10-CM

## 2022-03-14 MED ORDER — PANTOPRAZOLE SODIUM 40 MG PO TBEC: 40.0000 mg | DELAYED_RELEASE_TABLET | Freq: Two times a day (BID) | ORAL | 1 refills | Status: DC

## 2022-03-14 MED ORDER — SODIUM CHLORIDE 0.9 % IV SOLN: 500.0000 mL | INTRAVENOUS | Status: DC

## 2022-03-14 NOTE — Patient Instructions (Signed)
Please read handouts provided. Begin Protonix ( pantoprazole ) 40 mg twice daily for 8 weeks. Await pathology results. Repeat colonoscopy with a 2 day prep.   YOU HAD AN ENDOSCOPIC PROCEDURE TODAY AT [facility]:   Refer to the procedure report that was given to you for any specific questions about what was found during the examination.  If the procedure report does not answer your questions, please call your gastroenterologist to clarify.  If you requested that your care partner not be given the details of your procedure findings, then the procedure report has been included in a sealed envelope for you to review at your convenience later.  YOU SHOULD EXPECT: Some feelings of bloating in the abdomen. Passage of more gas than usual.  Walking can help get rid of the air that was put into your GI tract during the procedure and reduce the bloating. If you had a lower endoscopy (such as a colonoscopy or flexible sigmoidoscopy) you may notice spotting of blood in your stool or on the toilet paper. If you underwent a bowel prep for your procedure, you may not have a normal bowel movement for a few days.  Please Note:  You might notice some irritation and congestion in your nose or some drainage.  This is from the oxygen used during your procedure.  There is no need for concern and it should clear up in a day or so.  SYMPTOMS TO REPORT IMMEDIATELY:  Following lower endoscopy (colonoscopy or flexible sigmoidoscopy):  Excessive amounts of blood in the stool  Significant tenderness or worsening of abdominal pains  Swelling of the abdomen that is new, acute  Fever of 100F or higher  Following upper endoscopy (EGD)  Vomiting of blood or coffee ground material  New chest pain or pain under the shoulder blades  Painful or persistently difficult swallowing  New shortness of breath  Fever of 100F or higher  Black, tarry-looking stools  For urgent or emergent issues, a gastroenterologist  can be reached at any hour by calling [phone]. Do not use MyChart messaging for urgent concerns.    DIET:  We do recommend a small meal at first, but then you may proceed to your regular diet.  Drink plenty of fluids but you should avoid alcoholic beverages for 24 hours.  ACTIVITY:  You should plan to take it easy for the rest of today and you should NOT DRIVE or use heavy machinery until tomorrow (because of the sedation medicines used during the test).    FOLLOW UP: Our staff will call the number listed on your records 48-72 hours following your procedure to check on you and address any questions or concerns that you may have regarding the information given to you following your procedure. If we do not reach you, we will leave a message.  We will attempt to reach you two times.  During this call, we will ask if you have developed any symptoms of COVID 19. If you develop any symptoms (ie: fever, flu-like symptoms, shortness of breath, cough etc.) before then, please call [phone].  If you test positive for Covid 19 in the 2 weeks post procedure, please call and report this information to [place].    If any biopsies were taken you will be contacted by phone or by letter within the next 1-3 weeks.  Please call us at [phone] if you have not heard about the biopsies in 3 weeks.    SIGNATURES/CONFIDENTIALITY: You and/or your care  partner have signed paperwork which will be entered into your electronic medical record.  These signatures attest to the fact that that the information above on your After Visit Summary has been reviewed and is understood.  Full responsibility of the confidentiality of this discharge information lies with you and/or your care-partner.  

## 2022-03-14 NOTE — Op Note (Signed)
[facility] Patient Name: Austin Fuller Procedure Date: 03/14/2022 11:05 AM MRN: [id number] Endoscopist: Nicole Kindred "Austin Fuller ,  Age: 46 Referring MD:  Date of Birth: 12/11/1975 Gender: Male Account #: [id number] Procedure:                Upper GI endoscopy Indications:              Weight loss Medicines:                Monitored Anesthesia Care Procedure:                Pre-Anesthesia Assessment:                           - Prior to the procedure, a History and Physical                            was performed, and patient medications and                            allergies were reviewed. The patient's tolerance of                            previous anesthesia was also reviewed. The risks                            and benefits of the procedure and the sedation                            options and risks were discussed with the patient.                            All questions were answered, and informed consent                            was obtained. Prior Anticoagulants: The patient has                            taken no previous anticoagulant or antiplatelet                            agents. ASA Grade Assessment: III - A patient with                            severe systemic disease. After reviewing the risks                            and benefits, the patient was deemed in                            satisfactory condition to undergo the procedure.                           After obtaining informed consent, the endoscope was  passed under direct vision. Throughout the                            procedure, the patient's blood pressure, pulse, and                            oxygen saturations were monitored continuously. The                            Endoscope was introduced through the mouth, and                            advanced to the second part of duodenum. The upper                            GI endoscopy was accomplished  without difficulty.                            The patient tolerated the procedure well. Scope In: Scope Out: Findings:                 LA Grade A (one or more mucosal breaks less than 5                            mm, not extending between tops of 2 mucosal folds)                            esophagitis with no bleeding was found in the                            distal esophagus. Biopsies were taken with a cold                            forceps for histology.                           One 3 mm sessile polyp with no bleeding and no                            stigmata of recent bleeding was found in the                            gastric fundus. The polyp was removed with a cold                            biopsy forceps. Resection and retrieval were                            complete.                           Localized mild inflammation characterized by  congestion (edema) and erythema was found in the                            gastric antrum. Biopsies were taken with a cold                            forceps for histology.                           The examined duodenum was normal. Biopsies were                            taken with a cold forceps for histology. Complications:            No immediate complications. Estimated Blood Loss:     Estimated blood loss was minimal. Impression:               - LA Grade A esophagitis with no bleeding. Biopsied.                           - One gastric polyp. Resected and retrieved.                           - Gastritis. Biopsied.                           - Normal examined duodenum. Biopsied. Recommendation:           - Use Protonix (pantoprazole) 40 mg PO BID for 8                            weeks.                           - Await pathology results.                           - Perform a colonoscopy today. Nicole Kindred "Austin Fuller,  03/14/2022 11:32:21 AM

## 2022-03-14 NOTE — Op Note (Signed)
[facility] Patient Name: Austin Fuller Procedure Date: 03/14/2022 11:05 AM MRN: [id number] Endoscopist: Nicole Kindred "[name] ,  Age: 46 Referring MD:  Date of Birth: 18-Oct-1975 Gender: Male Account #: [id number] Procedure:                Colonoscopy Indications:              Weight loss Medicines:                Monitored Anesthesia Care Procedure:                After obtaining informed consent, the colonoscope                            was passed under direct vision. Throughout the                            procedure, the patient's blood pressure, pulse, and                            oxygen saturations were monitored continuously. The                            Olympus CF-HQ190L (Serial# [id number]) Colonoscope was                            introduced through the anus with the intention of                            advancing to the cecum. The scope was advanced to                            the transverse colon before the procedure was                            aborted. Medications were given. The colonoscopy                            was performed without difficulty. The patient                            tolerated the procedure well. The quality of the                            bowel preparation was poor. The rectum was                            photographed. Scope In: 11:23:02 AM Scope Out: 11:27:39 AM Total Procedure Duration: 0 hours 4 minutes 37 seconds  Findings:                 A large amount of stool was found in the entire                            colon, interfering with visualization.  Non-bleeding internal hemorrhoids were found during                            retroflexion. Complications:            No immediate complications. Estimated Blood Loss:     Estimated blood loss: none. Impression:               - Preparation of the colon was poor.                           - Stool in the entire examined colon.                            - Non-bleeding internal hemorrhoids.                           - No specimens collected. Recommendation:           - Discharge patient to home (with escort).                           - Repeat colonoscopy can be attempted tomorrow with                            an additional day of prep if the patient is                            amenable. If the patient does not wish to come back                            tomorrow, then a colonoscopy with a 2-day prep can                            be arranged at our next available endoscopy opening.                           - The findings and recommendations were discussed                            with the patient. Nicole Kindred "[name],  03/14/2022 11:37:11 AM

## 2022-03-14 NOTE — Progress Notes (Signed)
Called to room to assist during endoscopic procedure.  Patient ID and intended procedure confirmed with present staff. Received instructions for my participation in the procedure from the performing physician.  

## 2022-03-14 NOTE — Progress Notes (Signed)
GASTROENTEROLOGY PROCEDURE H&P NOTE   Primary Care Physician: SwazilandJordan, Betty G, MD    Reason for Procedure:   Weight loss, lower abdominal pain  Plan:    EGD/colonoscopy  Patient is appropriate for endoscopic procedure(s) in the ambulatory ([facility]) setting.  The nature of the procedure, as well as the risks, benefits, and alternatives were carefully and thoroughly reviewed with the patient. Ample time for discussion and questions allowed. The patient understood, was satisfied, and agreed to proceed.     HPI: Austin Fuller is a 46 y.o. male who presents for EGD/colonoscopy for evaluation of weight loss .  Patient was most recently seen in the Gastroenterology Clinic on 12/20/21.  He states that his abdominal pain has resolved after he stopped some of his diabetes medications. No other interval change in medical history since that appointment. Please refer to that note for full details regarding GI history and clinical presentation.   Past Medical History:  Diagnosis Date   Cataract    Diabetes mellitus without complication (HCC)    Hyperlipidemia    Hypertension     Past Surgical History:  Procedure Laterality Date   ANTERIOR CRUCIATE LIGAMENT REPAIR Left    KIDNEY STONE SURGERY     TONSILLECTOMY      Prior to Admission medications   Medication Sig Start Date End Date Taking? Authorizing Provider  cholecalciferol (VITAMIN D3) 25 MCG (1000 UNIT) tablet Take 4,000 Units by mouth daily.   Yes [provider]  gabapentin (NEURONTIN) 100 MG capsule Take 1 capsule (100 mg total) by mouth 3 (three) times daily. 02/06/22  Yes [place], Betty G, MD  metFORMIN (GLUCOPHAGE) 500 MG tablet 1 tab with breakfast and 2 tabs with supper. 11/16/21  Yes [place], Betty G, MD  pantoprazole (PROTONIX) 40 MG tablet Take 1 tablet (40 mg total) by mouth daily. 12/20/21  Yes Esterwood, Amy S, PA-C  B-D INS SYR ULTRAFINE 1CC/30G 30G X 1/2" 1 ML MISC Inject 1 Device into the skin daily.  04/05/20   [place], Betty G, MD  Continuous Blood Gluc Receiver (FREESTYLE LIBRE 14 DAY READER) DEVI Use as directed to test blood sugar 02/16/20   [place], Betty G, MD  Continuous Blood Gluc Sensor (FREESTYLE LIBRE 14 DAY SENSOR) MISC USE TO TEST BLOOD SUGAR AS DIRECTED 06/15/20   [place], Betty G, MD  Dulaglutide (TRULICITY) 1.5 MG/0.5ML SOPN Inject 1.5 mg into the skin once a week. Patient not taking: Reported on 03/14/2022 01/23/22   [place], Betty G, MD  insulin glargine (LANTUS) 100 UNIT/ML injection Inject 0.2 mLs (20 Units total) into the skin at bedtime. Patient not taking: Reported on 03/14/2022 10/01/20   [place], Betty G, MD  ondansetron (ZOFRAN) 4 MG tablet Take 1 tablet (4 mg total) by mouth every 6 (six) hours as needed for nausea or vomiting. 12/20/21   Esterwood, Amy S, PA-C    Current Outpatient Medications  Medication Sig Dispense Refill   cholecalciferol (VITAMIN D3) 25 MCG (1000 UNIT) tablet Take 4,000 Units by mouth daily.     gabapentin (NEURONTIN) 100 MG capsule Take 1 capsule (100 mg total) by mouth 3 (three) times daily. 90 capsule 2   metFORMIN (GLUCOPHAGE) 500 MG tablet 1 tab with breakfast and 2 tabs with supper. 180 tablet 1   pantoprazole (PROTONIX) 40 MG tablet Take 1 tablet (40 mg total) by mouth daily. 30 tablet 3   B-D INS SYR ULTRAFINE 1CC/30G 30G X 1/2" 1 ML MISC Inject 1 Device into the skin  daily. 100 each 3   Continuous Blood Gluc Receiver (FREESTYLE LIBRE 14 DAY READER) DEVI Use as directed to test blood sugar 1 each 0   Continuous Blood Gluc Sensor (FREESTYLE LIBRE 14 DAY SENSOR) MISC USE TO TEST BLOOD SUGAR AS DIRECTED 1 each 3   Dulaglutide (TRULICITY) 1.5 MG/0.5ML SOPN Inject 1.5 mg into the skin once a week. (Patient not taking: Reported on 03/14/2022) 2 mL 3   insulin glargine (LANTUS) 100 UNIT/ML injection Inject 0.2 mLs (20 Units total) into the skin at bedtime. (Patient not taking: Reported on 03/14/2022) 10 mL 3   ondansetron (ZOFRAN) 4 MG tablet Take 1 tablet  (4 mg total) by mouth every 6 (six) hours as needed for nausea or vomiting. 40 tablet 1   Current Facility-Administered Medications  Medication Dose Route Frequency Provider Last Rate Last Admin   0.9 %  sodium chloride infusion  500 mL Intravenous Continuous Imogene Burn, MD        Allergies as of 03/14/2022   (No Known Allergies)    Family History  Problem Relation Age of Onset   Early death Mother    Leukemia Mother    Early death Father    Heart attack Father    Heart attack Sister    Colon cancer Neg Hx    Colon polyps Neg Hx    Esophageal cancer Neg Hx    Rectal cancer Neg Hx    Stomach cancer Neg Hx     Social History   Socioeconomic History   Marital status: Single    Spouse name: Not on file   Number of children: Not on file   Years of education: Not on file   Highest education level: Associate degree: occupational, Scientist, product/process development, or vocational program  Occupational History   Not on file  Tobacco Use   Smoking status: Never   Smokeless tobacco: Never  Vaping Use   Vaping Use: Never used  Substance and Sexual Activity   Alcohol use: Not Currently   Drug use: Never   Sexual activity: Not Currently  Other Topics Concern   Not on file  Social History Narrative   Not on file   Social Determinants of Health   Financial Resource Strain: Low Risk    Difficulty of Paying Living Expenses: Not very hard  Food Insecurity: No Food Insecurity   Worried About Running Out of Food in the Last Year: Never true   Ran Out of Food in the Last Year: Never true  Transportation Needs: No Transportation Needs   Lack of Transportation (Medical): No   Lack of Transportation (Non-Medical): No  Physical Activity: Sufficiently Active   Days of Exercise per Week: 5 days   Minutes of Exercise per Session: 150+ min  Stress: No Stress Concern Present   Feeling of Stress : Not at all  Social Connections: Unknown   Frequency of Communication with Friends and Family: Not on file    Frequency of Social Gatherings with Friends and Family: Not on file   Attends Religious Services: Not on file   Active Member of Clubs or Organizations: No   Attends Banker Meetings: Not on file   Marital Status: Not on file  Intimate Partner Violence: Not on file    Physical Exam: Vital signs in last 24 hours: BP 136/88   Pulse 92   Temp 98 F (36.7 C)   Ht 5\' 6"  (1.676 m)   Wt 199 lb (90.3 kg)   SpO2 99%  BMI 32.12 kg/m  GEN: NAD EYE: Sclerae anicteric ENT: MMM CV: Non-tachycardic Pulm: No increased WOB GI: Soft NEURO:  Alert & Oriented   [name], MD [facility]   03/14/2022 11:00 AM

## 2022-03-14 NOTE — Progress Notes (Signed)
A and O x3. Report to RN. Tolerated MAC anesthesia well.Teeth unchanged after procedure. 

## 2022-03-14 NOTE — Progress Notes (Signed)
Patent informed that he will need a 2 day prep for colonoscopy. Patient did not want to schedule colonoscopy today. Patient requesting to call back and schedule the colonoscopy after looking at his schedule. B.Runell Kovich RN.

## 2022-03-15 ENCOUNTER — Telehealth: Payer: Self-pay | Admitting: *Deleted

## 2022-03-15 ENCOUNTER — Telehealth: Payer: Self-pay

## 2022-03-15 NOTE — Telephone Encounter (Signed)
Attempted to call patient for their post-procedure follow-up call. No answer.   

## 2022-03-15 NOTE — Telephone Encounter (Signed)
F/U call placed, message left. SChaplin, RN,BSN  

## 2022-03-21 ENCOUNTER — Encounter: Payer: Self-pay | Admitting: Internal Medicine

## 2022-03-31 ENCOUNTER — Other Ambulatory Visit: Payer: Self-pay

## 2022-03-31 MED ORDER — ONDANSETRON HCL 4 MG PO TABS: 4.0000 mg | ORAL_TABLET | Freq: Four times a day (QID) | ORAL | 1 refills | Status: DC | PRN

## 2022-07-05 ENCOUNTER — Other Ambulatory Visit: Payer: Self-pay

## 2022-07-05 DIAGNOSIS — E1169 Type 2 diabetes mellitus with other specified complication: Secondary | ICD-10-CM

## 2022-07-05 MED ORDER — METFORMIN HCL 500 MG PO TABS: ORAL_TABLET | ORAL | 1 refills | Status: DC

## 2022-10-10 ENCOUNTER — Ambulatory Visit (INDEPENDENT_AMBULATORY_CARE_PROVIDER_SITE_OTHER): Payer: PRIVATE HEALTH INSURANCE | Admitting: Family Medicine

## 2022-10-10 ENCOUNTER — Encounter: Payer: Self-pay | Admitting: Family Medicine

## 2022-10-10 VITALS — BP 110/70 | HR 100 | Temp 98.1°F | Ht 66.0 in | Wt 221.7 lb

## 2022-10-10 DIAGNOSIS — Z794 Long term (current) use of insulin: Secondary | ICD-10-CM

## 2022-10-10 DIAGNOSIS — E785 Hyperlipidemia, unspecified: Secondary | ICD-10-CM

## 2022-10-10 DIAGNOSIS — E1169 Type 2 diabetes mellitus with other specified complication: Secondary | ICD-10-CM

## 2022-10-10 LAB — LIPID PANEL
Cholesterol: 176 mg/dL (ref 0–200)
HDL: 38.5 mg/dL — ABNORMAL LOW (ref >39.00)
NonHDL: 137.41
Total CHOL/HDL Ratio: 5
Triglycerides: 207 mg/dL — ABNORMAL HIGH (ref 0.0–149.0)
VLDL: 41.4 mg/dL — ABNORMAL HIGH (ref 0.0–40.0)

## 2022-10-10 LAB — LDL CHOLESTEROL, DIRECT: Direct LDL: 104 mg/dL

## 2022-10-10 LAB — BASIC METABOLIC PANEL
BUN: 11 mg/dL (ref 6–23)
CO2: 28 mEq/L (ref 19–32)
Calcium: 9.7 mg/dL (ref 8.4–10.5)
Chloride: 101 mEq/L (ref 96–112)
Creatinine, Ser: 1 mg/dL (ref 0.40–1.50)
GFR: 90.26 mL/min (ref >60.00)
Glucose, Bld: 327 mg/dL — ABNORMAL HIGH (ref 70–99)
Potassium: 4.2 mEq/L (ref 3.5–5.1)
Sodium: 137 mEq/L (ref 135–145)

## 2022-10-10 LAB — HEPATIC FUNCTION PANEL
ALT: 37 U/L (ref 0–53)
AST: 20 U/L (ref 0–37)
Albumin: 4.4 g/dL (ref 3.5–5.2)
Alkaline Phosphatase: 104 U/L (ref 39–117)
Bilirubin, Direct: 0.1 mg/dL (ref 0.0–0.3)
Total Bilirubin: 0.5 mg/dL (ref 0.2–1.2)
Total Protein: 7.1 g/dL (ref 6.0–8.3)

## 2022-10-10 LAB — MICROALBUMIN / CREATININE URINE RATIO
Creatinine,U: 98.5 mg/dL
Microalb Creat Ratio: 0.8 mg/g (ref 0.0–30.0)
Microalb, Ur: 0.8 mg/dL (ref 0.0–1.9)

## 2022-10-10 LAB — HEMOGLOBIN A1C: Hgb A1c MFr Bld: 11.8 % — ABNORMAL HIGH (ref 4.6–6.5)

## 2022-10-10 MED ORDER — TRULICITY 1.5 MG/0.5ML ~~LOC~~ SOAJ: 1.5000 mg | SUBCUTANEOUS | 5 refills | Status: DC

## 2022-10-10 NOTE — Progress Notes (Signed)
Established Patient Office Visit  Subjective   Patient ID: Austin Fuller, male    DOB: Feb 27, 1976  Age: 46 y.o. MRN: [id number]  Chief Complaint  Patient presents with   Follow-up    HPI   Patient has history of hypertension, diabetes, hyperlipidemia, obesity.  For some reason he was scheduled to see me for follow-up of his diabetes and other medical problems and the absence of his primary he was out this week.  He states that he ran out of Trulicity about a month ago.  He has been taking some type of insulin but thought he was having some associated nausea.  He does remain on metformin.  His last A1c was last February was 7.8%.  Not monitoring blood sugars regularly.  He is in process of setting up repeat diabetic eye exam.  He is not on statin and not clear from history why is not taking statin at this time.  He has history of GERD which is controlled with Protonix.  He states he had hypertension in the past but apparently blood pressures recently been stable off medication.  Past Medical History:  Diagnosis Date   Cataract    Diabetes mellitus without complication (HCC)    Hyperlipidemia    Hypertension    Past Surgical History:  Procedure Laterality Date   ANTERIOR CRUCIATE LIGAMENT REPAIR Left    KIDNEY STONE SURGERY     TONSILLECTOMY      reports that he has never smoked. He has never used smokeless tobacco. He reports that he does not currently use alcohol. He reports that he does not use drugs. family history includes Early death in his father and mother; Heart attack in his father and sister; Leukemia in his mother. No Known Allergies  Review of Systems  Constitutional:  Negative for chills, fever and malaise/fatigue.  Eyes:  Negative for blurred vision.  Respiratory:  Negative for shortness of breath.   Cardiovascular:  Negative for chest pain.  Neurological:  Negative for dizziness, weakness and headaches.      Objective:     BP 110/70 (BP  Location: Left Arm, Patient Position: Sitting, Cuff Size: Normal)   Pulse 100   Temp 98.1 F (36.7 C) (Oral)   Ht 5\' 6"  (1.676 m)   Wt 221 lb 11.2 oz (100.6 kg)   SpO2 98%   BMI 35.78 kg/m    Physical Exam Vitals reviewed.  Constitutional:      Appearance: He is well-developed.  Eyes:     Pupils: Pupils are equal, round, and reactive to light.  Neck:     Thyroid: No thyromegaly.  Cardiovascular:     Rate and Rhythm: Normal rate and regular rhythm.  Pulmonary:     Effort: Pulmonary effort is normal. No respiratory distress.     Breath sounds: Normal breath sounds. No wheezing or rales.  Musculoskeletal:     Cervical back: Neck supple.     Right lower leg: No edema.     Left lower leg: No edema.  Neurological:     Mental Status: He is alert and oriented to person, place, and time.      No results found for any visits on 10/10/22.    The 10-year ASCVD risk score (Arnett DK, et al., 2019) is: 2.6%    Assessment & Plan:   #1 type 2 diabetes.  History of poor control.  Recent poor compliance with medications.  Out of Trulicity for about 1 month.  He states he  was tolerating Trulicity well without nausea or vomiting.  We refilled the Trulicity.  Recheck labs including A1c.  Also check urine microalbumin.  Needs diabetic eye exam and he is setting this up.  Needs close follow-up with primary  #2 hyperlipidemia.  Recheck fasting lipids today.  Consider starting back statin with his diabetes status.  No reported previous intolerance to statins.     No follow-ups on file.    Evelena Peat, MD

## 2022-10-10 NOTE — Patient Instructions (Signed)
Set up diabetic eye exam soon

## 2022-10-11 ENCOUNTER — Other Ambulatory Visit: Payer: Self-pay

## 2022-10-11 MED ORDER — ROSUVASTATIN CALCIUM 10 MG PO TABS: 10.0000 mg | ORAL_TABLET | Freq: Every day | ORAL | 0 refills | Status: DC

## 2022-10-11 MED ORDER — EMPAGLIFLOZIN 10 MG PO TABS: 10.0000 mg | ORAL_TABLET | Freq: Every day | ORAL | 0 refills | Status: DC

## 2023-04-11 ENCOUNTER — Other Ambulatory Visit: Payer: Self-pay | Admitting: Family Medicine

## 2023-04-14 ENCOUNTER — Other Ambulatory Visit: Payer: Self-pay | Admitting: Family Medicine

## 2023-05-21 IMAGING — CT CT ABD-PELV W/ CM
2 of 5 series · 15 of 46 positions shown, 17 images · IV contrast (agent unspecified)
Comparison: None.

CLINICAL DATA: Unintended weight loss. Lower abdominal pain and
nausea x6 months.

EXAM:
CT ABDOMEN AND PELVIS WITH CONTRAST
TECHNIQUE: Multidetector CT imaging of the abdomen and pelvis was performed
using the standard protocol following bolus administration of
intravenous contrast.

[Series 4: abd/pel w · axial · 0.85mm/px · z∈[-403,+47]mm · 12 of 101 slices shown, 14 images]
[im 6/101  soft-tissue]
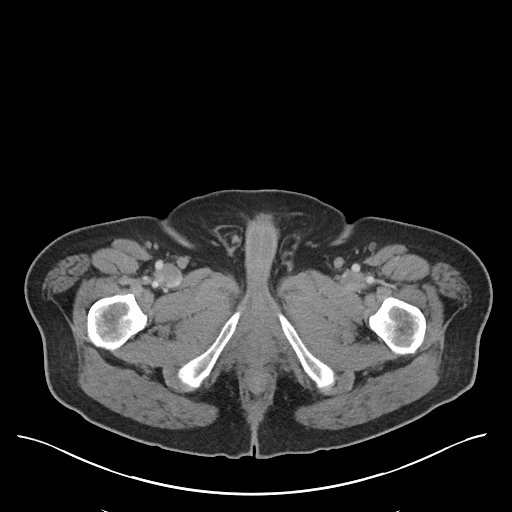
[im 6/101  bone]
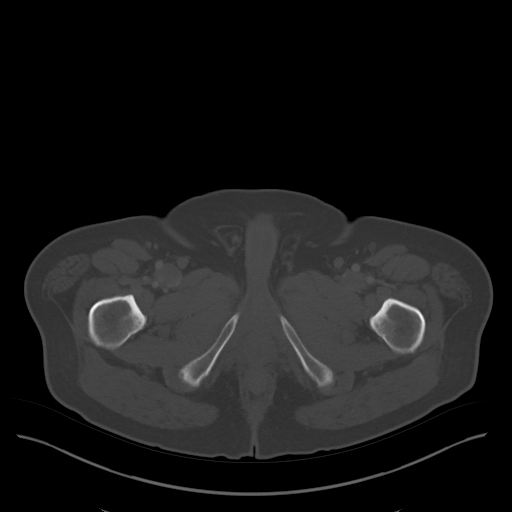
[im 16/101  soft-tissue]
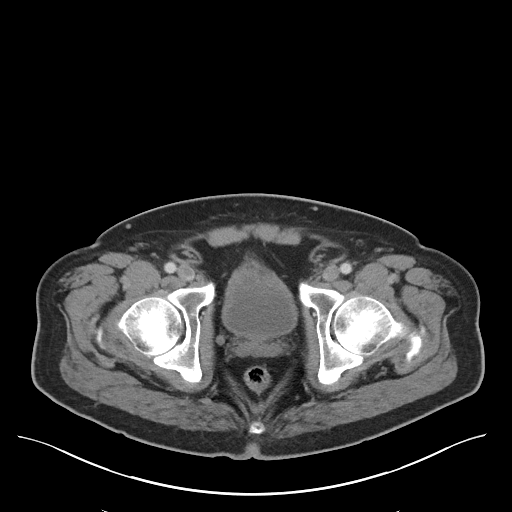
[im 21/101  soft-tissue]
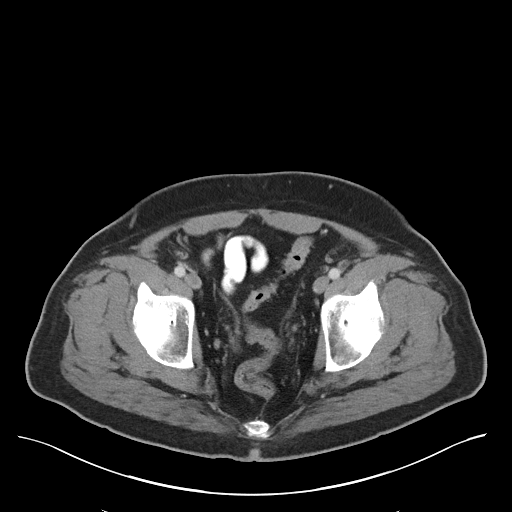
[im 31/101  soft-tissue]
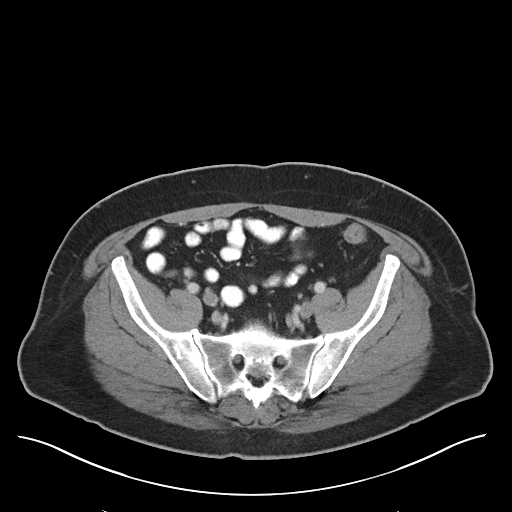
[im 41/101  soft-tissue]
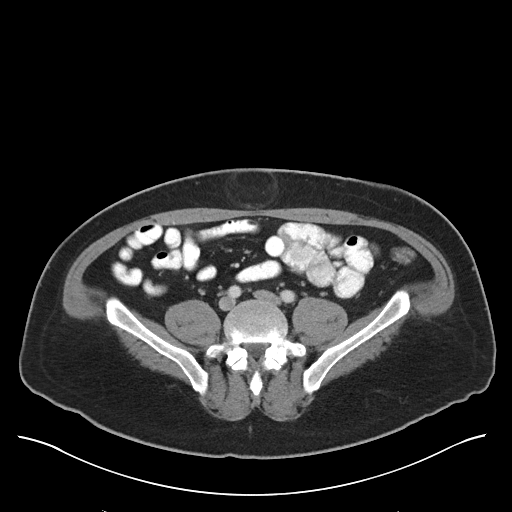
[im 46/101  soft-tissue]
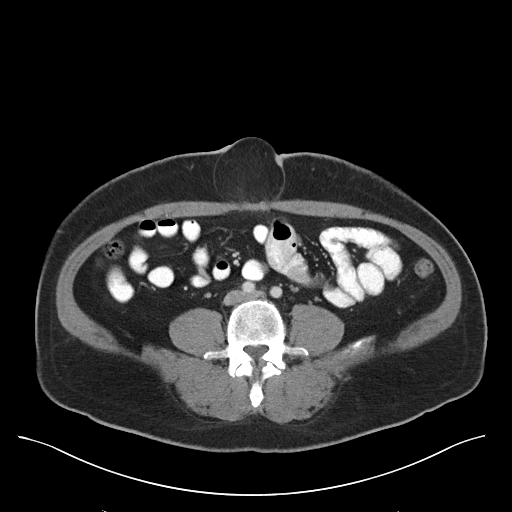
[im 56/101  soft-tissue]
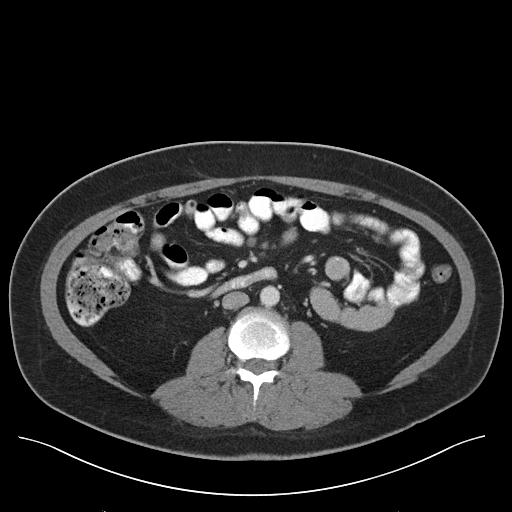
[im 61/101  soft-tissue]
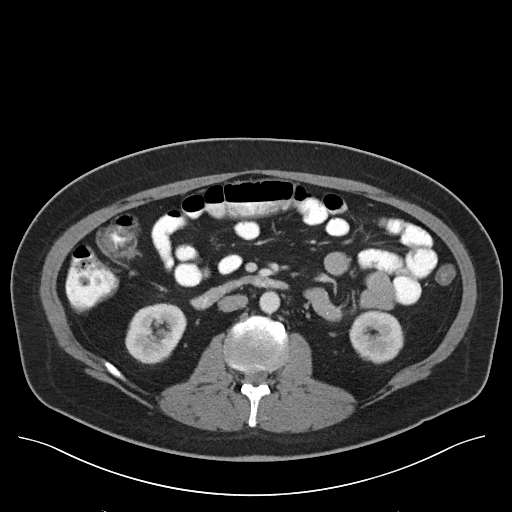
[im 71/101  soft-tissue]
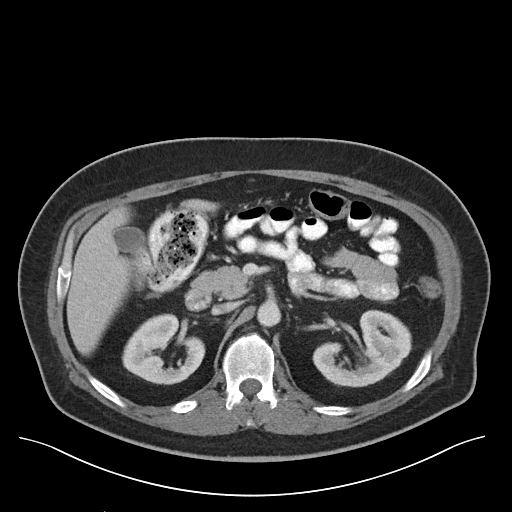
[im 71/101  bone]
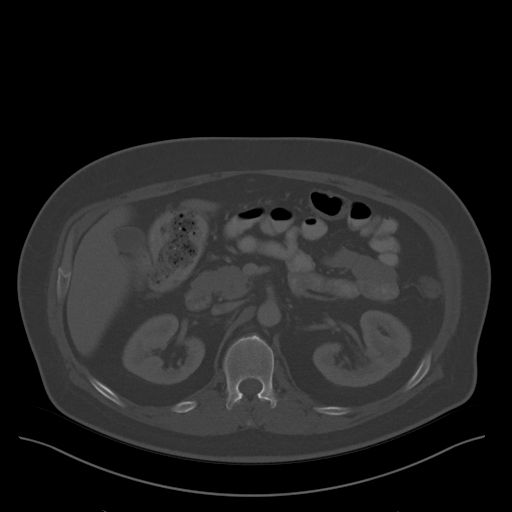
[im 81/101  soft-tissue]
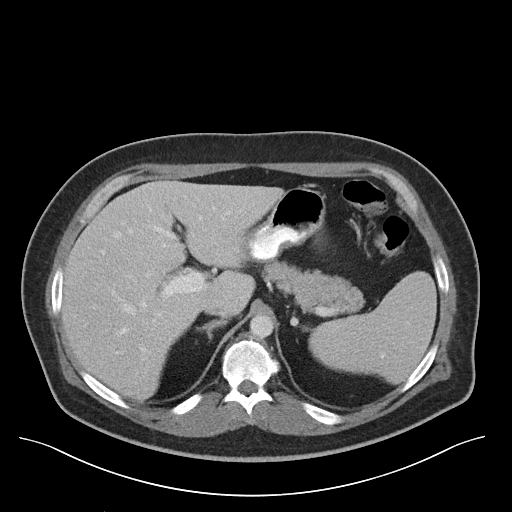
[im 86/101  soft-tissue]
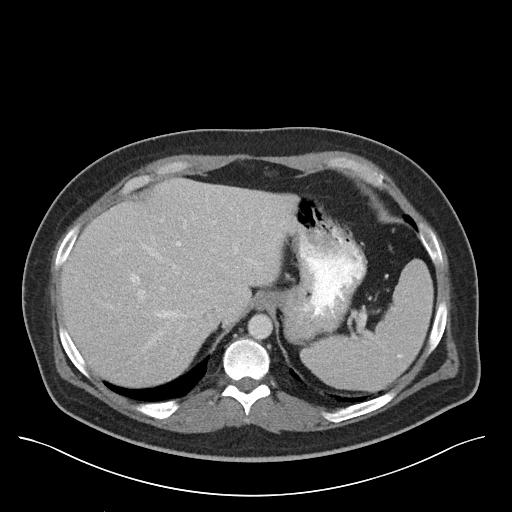
[im 96/101  soft-tissue]
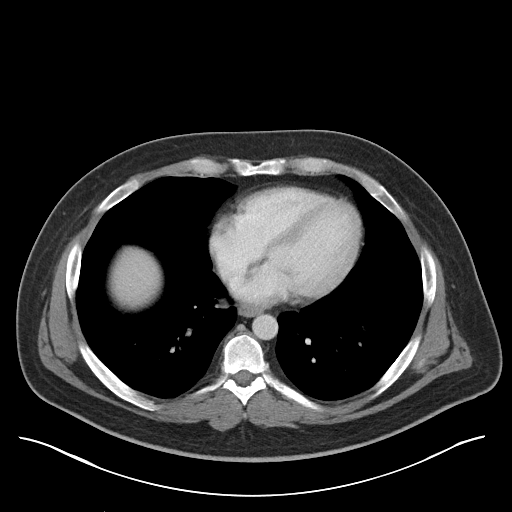

[Series 7: coronal st · coronal · 0.77mm/px · 3 of 84 slices shown]
[im 28/84  soft-tissue]
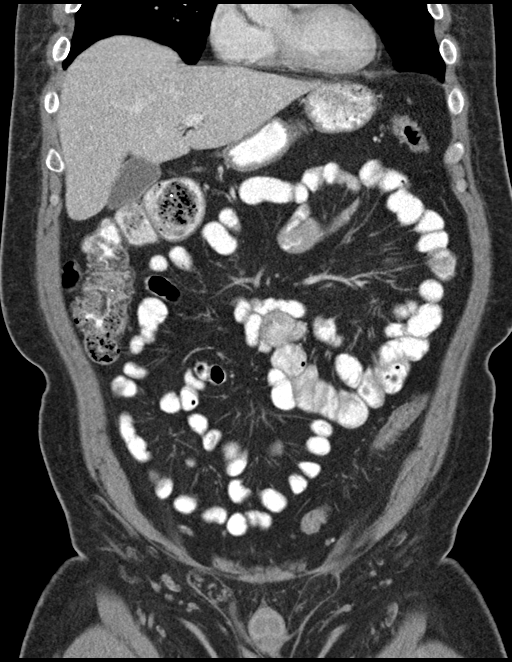
[im 37/84  soft-tissue]
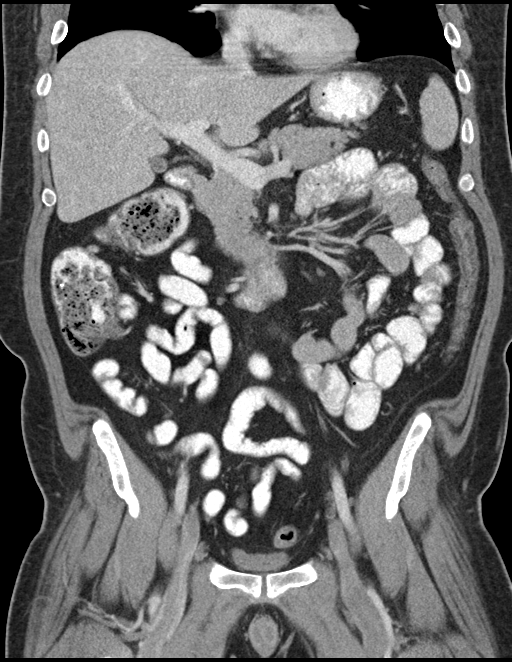
[im 47/84  soft-tissue]
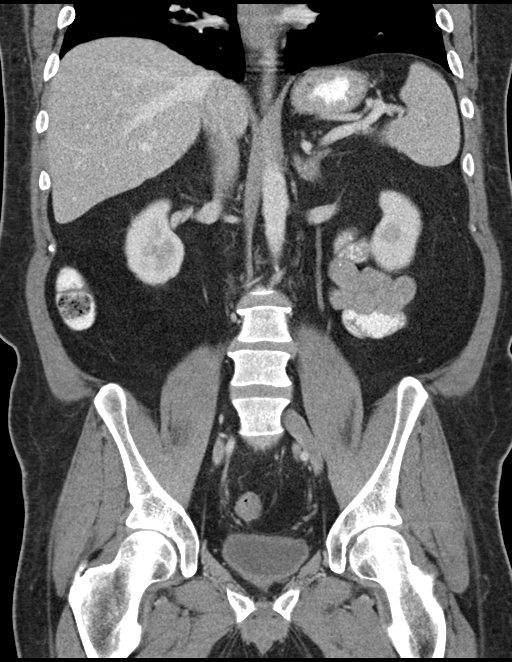

[15 of 46 positions shown; findings below may reference images not displayed]

RADIATION DOSE REDUCTION: This exam was performed according to the
departmental dose-optimization program which includes automated
exposure control, adjustment of the mA and/or kV according to
patient size and/or use of iterative reconstruction technique.

CONTRAST:  100mL OMNIPAQUE IOHEXOL 300 MG/ML  SOLN
FINDINGS: Lower chest: No acute abnormality.

Hepatobiliary: No suspicious hepatic lesion. Gallbladder is
unremarkable. No biliary ductal dilation.

Pancreas: No pancreatic ductal dilation or evidence of acute
inflammation.

Spleen: No splenomegaly or focal splenic lesion.

Adrenals/Urinary Tract: Bilateral adrenal glands appear normal. No
hydronephrosis. Bilateral renal sinus cysts. Kidneys demonstrate
symmetric enhancement and excretion of contrast material. Punctate
nonobstructive right interpolar renal calculus. Urinary bladder is
unremarkable for degree of distension.

Stomach/Bowel: Radiopaque enteric contrast material traverses the
hepatic flexure. Stomach is unremarkable for degree of distension.
No pathologic dilation of small or large bowel. The appendix and
terminal ileum appear normal. No evidence of acute bowel
inflammation.

Vascular/Lymphatic: Normal caliber abdominal aorta. No
pathologically enlarged abdominal or pelvic lymph nodes.

Reproductive: Prostate is unremarkable.

Other: Moderate-sized fat containing umbilical hernia. Small
bilateral fat containing inguinal hernias.

Musculoskeletal: 16 mm mixed lesion in the right iliac bone at the
SI joint on image 73/4 demonstrates a narrow zone of transition and
is favored to reflect a benign fibroosseous lesion. Multilevel
degenerative changes spine.
IMPRESSION: 1. No acute abdominopelvic findings.
2. Punctate nonobstructive right interpolar renal calculus.
3. Moderate-sized fat containing umbilical hernia.
4. Small bilateral fat containing inguinal hernias.
5. Mixed 16 mm lesion in the right iliac bone at the SI joint
demonstrates a narrow zone of transition and is favored to reflect a
benign fibroosseous lesion.

## 2023-06-27 ENCOUNTER — Telehealth: Payer: Self-pay | Admitting: Family Medicine

## 2023-06-27 DIAGNOSIS — Z794 Long term (current) use of insulin: Secondary | ICD-10-CM

## 2023-06-27 MED ORDER — METFORMIN HCL 500 MG PO TABS: ORAL_TABLET | ORAL | 0 refills | Status: DC

## 2023-06-27 NOTE — Telephone Encounter (Signed)
Pt is aware last seen dr Swaziland over a year ago and last seen dr burchette over 8 months ago an patient did not make an appt and would like a refill on metFORMIN (GLUCOPHAGE) 500 MG tablet  [facility] - [place] - [address] Phone: [phone]  Fax: [phone]

## 2023-07-18 ENCOUNTER — Other Ambulatory Visit: Payer: Self-pay | Admitting: Family Medicine

## 2023-07-25 ENCOUNTER — Other Ambulatory Visit: Payer: Self-pay | Admitting: Family Medicine

## 2023-07-25 DIAGNOSIS — E1169 Type 2 diabetes mellitus with other specified complication: Secondary | ICD-10-CM

## 2023-08-17 ENCOUNTER — Encounter: Payer: Self-pay | Admitting: Family Medicine

## 2023-08-17 ENCOUNTER — Ambulatory Visit (INDEPENDENT_AMBULATORY_CARE_PROVIDER_SITE_OTHER): Payer: PRIVATE HEALTH INSURANCE | Admitting: Family Medicine

## 2023-08-17 VITALS — BP 110/70 | HR 70 | Resp 12 | Ht 66.0 in | Wt 205.0 lb

## 2023-08-17 DIAGNOSIS — E1169 Type 2 diabetes mellitus with other specified complication: Secondary | ICD-10-CM | POA: Diagnosis not present

## 2023-08-17 DIAGNOSIS — Z794 Long term (current) use of insulin: Secondary | ICD-10-CM

## 2023-08-17 DIAGNOSIS — I10 Essential (primary) hypertension: Secondary | ICD-10-CM | POA: Diagnosis not present

## 2023-08-17 DIAGNOSIS — E785 Hyperlipidemia, unspecified: Secondary | ICD-10-CM

## 2023-08-17 LAB — COMPREHENSIVE METABOLIC PANEL
ALT: 15 U/L (ref 0–53)
AST: 12 U/L (ref 0–37)
Albumin: 4 g/dL (ref 3.5–5.2)
Alkaline Phosphatase: 124 U/L — ABNORMAL HIGH (ref 39–117)
BUN: 9 mg/dL (ref 6–23)
CO2: 29 meq/L (ref 19–32)
Calcium: 9.4 mg/dL (ref 8.4–10.5)
Chloride: 102 meq/L (ref 96–112)
Creatinine, Ser: 0.87 mg/dL (ref 0.40–1.50)
GFR: 102.86 mL/min (ref >60.00)
Glucose, Bld: 393 mg/dL — ABNORMAL HIGH (ref 70–99)
Potassium: 4 meq/L (ref 3.5–5.1)
Sodium: 137 meq/L (ref 135–145)
Total Bilirubin: 0.3 mg/dL (ref 0.2–1.2)
Total Protein: 6.5 g/dL (ref 6.0–8.3)

## 2023-08-17 LAB — LIPID PANEL
Cholesterol: 110 mg/dL (ref 0–200)
HDL: 36.9 mg/dL — ABNORMAL LOW (ref >39.00)
LDL Cholesterol: 41 mg/dL (ref 0–99)
NonHDL: 73.37
Total CHOL/HDL Ratio: 3
Triglycerides: 160 mg/dL — ABNORMAL HIGH (ref 0.0–149.0)
VLDL: 32 mg/dL (ref 0.0–40.0)

## 2023-08-17 LAB — MICROALBUMIN / CREATININE URINE RATIO
Creatinine,U: 44.2 mg/dL
Microalb Creat Ratio: 2 mg/g (ref 0.0–30.0)
Microalb, Ur: 0.9 mg/dL (ref 0.0–1.9)

## 2023-08-17 LAB — HEMOGLOBIN A1C: Hgb A1c MFr Bld: 15.2 % — ABNORMAL HIGH (ref 4.6–6.5)

## 2023-08-17 MED ORDER — ROSUVASTATIN CALCIUM 10 MG PO TABS: 10.0000 mg | ORAL_TABLET | Freq: Every day | ORAL | 3 refills | Status: DC

## 2023-08-17 MED ORDER — FREESTYLE LIBRE 14 DAY READER DEVI: 0 refills | Status: AC

## 2023-08-17 MED ORDER — FREESTYLE LIBRE 14 DAY SENSOR MISC: 3 refills | Status: AC

## 2023-08-17 MED ORDER — METFORMIN HCL 500 MG PO TABS: ORAL_TABLET | ORAL | 1 refills | Status: DC

## 2023-08-17 MED ORDER — TRULICITY 0.75 MG/0.5ML ~~LOC~~ SOAJ: 0.7500 mg | SUBCUTANEOUS | 0 refills | Status: DC

## 2023-08-17 NOTE — Assessment & Plan Note (Signed)
BP adequately controlled. Continue nonpharmacologic treatment. Recommend monitoring BP regularly at home. Eye exam is overdue.

## 2023-08-17 NOTE — Assessment & Plan Note (Addendum)
Continue rosuvastatin 10 mg daily and low-fat diet. Last LDL 78 in 11/2021. Further recommendation will be given according to lipid panel result.

## 2023-08-17 NOTE — Patient Instructions (Addendum)
A few things to remember from today's visit:  Type 2 diabetes mellitus with other specified complication, with long-term current use of insulin (HCC) - Plan: Hemoglobin A1c, Comprehensive metabolic panel, Microalbumin / creatinine urine ratio, CANCELED: POC HgB A1c  Hypertension, essential, benign  Hyperlipidemia associated with type 2 diabetes mellitus (HCC) - Plan: Lipid panel  No changes now but will make some depending of blood sugar and hemoglobin A1C result.  If you need refills for medications you take chronically, please call your pharmacy. Do not use My Chart to request refills or for acute issues that need immediate attention. If you send a my chart message, it may take a few days to be addressed, specially if I am not in the office.  Please be sure medication list is accurate. If a new problem present, please set up appointment sooner than planned today.

## 2023-08-17 NOTE — Progress Notes (Unsigned)
HPI: Mr.Jyair Aarish Rockers is a 47 y.o. male with a PMHx significant for HTN, DM II, and HLD, who is here today for chronic disease management.  Last seen on 02/06/2022  Diabetes Mellitus II: Diagnosed in 2017. - Checking BG at home: He has not been checking his blood sugar at home.  - Medications: Currently on metformin 500 mg with breakfast and 1000 mg at dinner and Lantus 20-25 units daily - Compliance: He is not currently taking Trulicity 1.5 mg weekly because of stomach issues. He does not recall ever taking Jardiance 10 mg daily.  - Diet: He says he has not been avoiding sweets very well lately.  - eye exam: He says he has not had an eye exam for a while. He plans to have one as soon as his new insurance activates.  - foot exam: 06/2021. -He says he has never seen a nutritionist before.  -He endorses recent polydipsia, polyuria, and increased hunger. He says he occasionally has numbness and tingling in his feet but no more than usual since he discontinued gabapentin. - Negative for symptoms of hypoglycemia,foot ulcers/trauma, abdominal pain, nausea, vomiting,or abnormal weight loss.   Lab Results  Component Value Date   HGBA1C 11.8 (H) 10/10/2022   Lab Results  Component Value Date   MICROALBUR 0.8 10/10/2022   Hyperlipidemia: Currently on rosuvastatin 10 mg daily.  He has tolerated medication well.  Lab Results  Component Value Date   CHOL 176 10/10/2022   HDL 38.50 (L) 10/10/2022   LDLCALC 70 11/16/2021   LDLDIRECT 104.0 10/10/2022   TRIG 207.0 (H) 10/10/2022   CHOLHDL 5 10/10/2022   Hypertension: Since 2017. He used to take BP medication in the past, valsartan, but is not currently taking any because his BP has been well controlled for some time.  He is not checking BP regularly.  Lab Results  Component Value Date   NA 137 10/10/2022   CL 101 10/10/2022   K 4.2 10/10/2022   CO2 28 10/10/2022   BUN 11 10/10/2022   CREATININE 1.00 10/10/2022   GFR 90.26  10/10/2022   CALCIUM 9.7 10/10/2022   ALBUMIN 4.4 10/10/2022   GLUCOSE 327 (H) 10/10/2022   He is no longer taking pantoprazole.    Review of Systems  Constitutional:  Negative for chills and fever.  HENT:  Negative for hearing loss and sore throat.   Respiratory:  Negative for cough and wheezing.   Endocrine: Negative for cold intolerance and heat intolerance.  Genitourinary:  Negative for decreased urine volume, dysuria and hematuria.  Skin:  Negative for rash.  Neurological:  Negative for syncope and facial asymmetry.  See other pertinent positives and negatives in HPI.  Current Outpatient Medications on File Prior to Visit  Medication Sig Dispense Refill   B-D INS SYR ULTRAFINE 1CC/30G 30G X 1/2" 1 ML MISC Inject 1 Device into the skin daily. 100 each 3   cholecalciferol (VITAMIN D3) 25 MCG (1000 UNIT) tablet Take 4,000 Units by mouth daily.     ondansetron (ZOFRAN) 4 MG tablet Take 1 tablet (4 mg total) by mouth every 6 (six) hours as needed for nausea or vomiting. 40 tablet 1   No current facility-administered medications on file prior to visit.    Past Medical History:  Diagnosis Date   Cataract    Diabetes mellitus without complication (HCC)    Hyperlipidemia    Hypertension    No Known Allergies  Social History   Socioeconomic History  Marital status: Single    Spouse name: Not on file   Number of children: Not on file   Years of education: Not on file   Highest education level: Associate degree: occupational, Scientist, product/process development, or vocational program  Occupational History   Not on file  Tobacco Use   Smoking status: Never   Smokeless tobacco: Never  Vaping Use   Vaping status: Never Used  Substance and Sexual Activity   Alcohol use: Not Currently   Drug use: Never   Sexual activity: Not Currently  Other Topics Concern   Not on file  Social History Narrative   Not on file   Social Determinants of Health   Financial Resource Strain: Low Risk  (08/13/2023)    Overall Financial Resource Strain (CARDIA)    Difficulty of Paying Living Expenses: Not very hard  Food Insecurity: No Food Insecurity (08/13/2023)   Hunger Vital Sign    Worried About Running Out of Food in the Last Year: Never true    Ran Out of Food in the Last Year: Never true  Transportation Needs: No Transportation Needs (08/13/2023)   PRAPARE - Administrator, Civil Service (Medical): No    Lack of Transportation (Non-Medical): No  Physical Activity: Unknown (08/13/2023)   Exercise Vital Sign    Days of Exercise per Week: 0 days    Minutes of Exercise per Session: Not on file  Stress: No Stress Concern Present (08/13/2023)   [employer] of Occupational Health - Occupational Stress Questionnaire    Feeling of Stress : Not at all  Social Connections: Socially Isolated (08/13/2023)   Social Connection and Isolation Panel [NHANES]    Frequency of Communication with Friends and Family: Once a week    Frequency of Social Gatherings with Friends and Family: Never    Attends Religious Services: Never    Database administrator or Organizations: No    Attends Engineer, structural: Not on file    Marital Status: Never married   Vitals:   08/17/23 1105  BP: 110/70  Pulse: 70  Resp: 12  SpO2: 98%   Body mass index is 33.09 kg/m.  Physical Exam Vitals and nursing note reviewed.  Constitutional:      General: He is not in acute distress.    Appearance: He is well-developed.  HENT:     Head: Normocephalic and atraumatic.     Mouth/Throat:     Mouth: Mucous membranes are moist.  Eyes:     Conjunctiva/sclera: Conjunctivae normal.  Cardiovascular:     Rate and Rhythm: Normal rate and regular rhythm.     Pulses:          Dorsalis pedis pulses are 2+ on the right side and 2+ on the left side.     Heart sounds: No murmur heard. Pulmonary:     Effort: Pulmonary effort is normal. No respiratory distress.     Breath sounds: Normal breath sounds.   Abdominal:     Palpations: Abdomen is soft. There is no hepatomegaly or mass.     Tenderness: There is no abdominal tenderness.  Musculoskeletal:     Right lower leg: No edema.     Left lower leg: No edema.  Skin:    General: Skin is warm.     Findings: No erythema or rash.  Neurological:     Mental Status: He is alert and oriented to person, place, and time.     Cranial Nerves: No cranial nerve deficit.  Gait: Gait normal.  Psychiatric:        Mood and Affect: Mood and affect normal.   ASSESSMENT AND PLAN:  Mr. Rammel was seen today for chronic disease management.   Orders Placed This Encounter  Procedures   Hemoglobin A1c   Comprehensive metabolic panel   Lipid panel   Microalbumin / creatinine urine ratio   Amb Referral to Nutrition and Diabetic Education  *** Type 2 diabetes mellitus with other specified complication, with long-term current use of insulin (HCC) Assessment & Plan: Problem has not been adequately controlled, last hemoglobin A1c was 11.8 in 09/2022. A1c today not able to read, so hemoglobin A1c ordered. We discussed possible complications of elevated glucose. Continue Lantus 25 units daily, which he resume 2 months ago. He agrees with starting back on Trulicity, prescription for 0.75 mg to take weekly sent to his pharmacy.  Will resume patient assistance if needed. Continue metformin 500 mg a.m. and at 1000 mg p.m. Regular exercise and healthy diet with avoidance of added sugar food intake is an important part of treatment and recommended. Annual eye exam, periodic dental and foot care recommended. F/U in 3-4 months.  Orders: -     Hemoglobin A1c; Future -     Comprehensive metabolic panel; Future -     Microalbumin / creatinine urine ratio; Future -     FreeStyle Libre 14 Day Reader; Use as directed to test blood sugar  Dispense: 1 each; Refill: 0 -     FreeStyle Libre 14 Day Sensor; USE TO TEST BLOOD SUGAR AS DIRECTED  Dispense: 1 each;  Refill: 3 -     Trulicity; Inject 0.75 mg into the skin once a week.  Dispense: 2 mL; Refill: 0 -     Amb Referral to Nutrition and Diabetic Education -     metFORMIN HCl; 1 tab with breakfast and 2 tabs with supper.  Dispense: 180 tablet; Refill: 1  Hypertension, essential, benign Assessment & Plan: BP adequately controlled. Continue nonpharmacologic treatment. Recommend monitoring BP regularly at home. Eye exam is overdue.   Hyperlipidemia associated with type 2 diabetes mellitus (HCC) Assessment & Plan: Continue rosuvastatin 10 mg daily and low-fat diet. Last LDL 78 in 11/2021. Further recommendation will be given according to lipid panel result.  Orders: -     Lipid panel; Future  Other orders -     Rosuvastatin Calcium; Take 1 tablet (10 mg total) by mouth daily. Due for follow up.  Dispense: 90 tablet; Refill: 3   Return in about 15 weeks (around 11/30/2023) for chronic problems.  I, Rolla Etienne Wierda, acting as a scribe for Adelheid Hoggard [place], MD., have documented all relevant documentation on the behalf of Payge Eppes [place], MD, as directed by  Samary Shatz [place], MD while in the presence of Jaray Boliver [place], MD.   I, Matthewjames Petrasek [place], MD, have reviewed all documentation for this visit. The documentation on 08/17/23 for the exam, diagnosis, procedures, and orders are all accurate and complete.  Aletheia Tangredi G. [place], MD  [facility]. [place] office.

## 2023-08-17 NOTE — Assessment & Plan Note (Signed)
Problem has not been adequately controlled, last hemoglobin A1c was 11.8 in 09/2022. A1c today not able to read, so hemoglobin A1c ordered. We discussed possible complications of elevated glucose. Continue Lantus 25 units daily, which he resume 2 months ago. He agrees with starting back on Trulicity, prescription for 0.75 mg to take weekly sent to his pharmacy.  Will resume patient assistance if needed. Continue metformin 500 mg a.m. and at 1000 mg p.m. Regular exercise and healthy diet with avoidance of added sugar food intake is an important part of treatment and recommended. Annual eye exam, periodic dental and foot care recommended. F/U in 3-4 months.

## 2023-08-18 MED ORDER — INSULIN GLARGINE 100 UNITS/ML SOLOSTAR PEN: 30.0000 [IU] | PEN_INJECTOR | Freq: Every day | SUBCUTANEOUS | 2 refills | Status: AC

## 2023-08-20 ENCOUNTER — Ambulatory Visit: Payer: PRIVATE HEALTH INSURANCE | Admitting: Dietician

## 2023-08-23 ENCOUNTER — Encounter: Payer: Self-pay | Admitting: Internal Medicine

## 2024-01-08 DIAGNOSIS — H25812 Combined forms of age-related cataract, left eye: Secondary | ICD-10-CM | POA: Diagnosis not present

## 2024-02-05 ENCOUNTER — Encounter: Payer: Self-pay | Admitting: Family Medicine

## 2024-02-05 ENCOUNTER — Ambulatory Visit (INDEPENDENT_AMBULATORY_CARE_PROVIDER_SITE_OTHER): Payer: PRIVATE HEALTH INSURANCE | Admitting: Family Medicine

## 2024-02-05 VITALS — BP 122/86 | HR 64 | Temp 98.6°F | Resp 16 | Ht 66.0 in | Wt 189.8 lb

## 2024-02-05 DIAGNOSIS — R748 Abnormal levels of other serum enzymes: Secondary | ICD-10-CM | POA: Diagnosis not present

## 2024-02-05 DIAGNOSIS — Z794 Long term (current) use of insulin: Secondary | ICD-10-CM

## 2024-02-05 DIAGNOSIS — M2550 Pain in unspecified joint: Secondary | ICD-10-CM | POA: Diagnosis not present

## 2024-02-05 DIAGNOSIS — I10 Essential (primary) hypertension: Secondary | ICD-10-CM | POA: Diagnosis not present

## 2024-02-05 DIAGNOSIS — G629 Polyneuropathy, unspecified: Secondary | ICD-10-CM

## 2024-02-05 DIAGNOSIS — E1169 Type 2 diabetes mellitus with other specified complication: Secondary | ICD-10-CM

## 2024-02-05 LAB — POCT GLYCOSYLATED HEMOGLOBIN (HGB A1C): Hemoglobin A1C: 13.4 % — AB (ref 4.0–5.6)

## 2024-02-05 MED ORDER — EMPAGLIFLOZIN 25 MG PO TABS: 25.0000 mg | ORAL_TABLET | Freq: Every day | ORAL | 1 refills | Status: DC

## 2024-02-05 MED ORDER — DULOXETINE HCL 30 MG PO CPEP
30.0000 mg | ORAL_CAPSULE | Freq: Every day | ORAL | 1 refills | Status: DC
Start: 2024-02-05 — End: 2024-07-09

## 2024-02-05 NOTE — Progress Notes (Signed)
 HPI: Austin Fuller is a 48 y.o. male with a PMHx significant for HTN, DM II, and HLD, who is here today for chronic disease management.  Last seen on 08/17/2023 .  Diabetes Mellitus II: Dx'ed in 2017 - Checking BG at home: Austin Fuller has not been checking his blood sugar regularly at home.  - Medications: Currently on Metformin  500 mg with breakfast and 1000 mg with dinner. Also on Lantus  30 units daily. Austin Fuller says Austin Fuller forgets to take medication a couple of nights per week. Austin Fuller is not taking Trulicity  because of cost.  - Diet: Austin Fuller says his diet is not great. 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 - eye exam: UTD on routine vision care. Austin Fuller was told at his last appointment that Austin Fuller will need cataract surgery in both eyes.  - dental: UTD on routine dental care.   - Austin Fuller endorses occasional numbness and tingling in his feet.  - Negative for symptoms of hypoglycemia, polyuria, polydipsia, foot ulcers/trauma  Lab Results  Component Value Date   HGBA1C 15.2 (H) 08/17/2023   Lab Results  Component Value Date   MICROALBUR 0.9 08/17/2023   Body aches/arthralgia Austin Fuller reports Austin Fuller is still having generalized body aches and arthralgias. Problem started around 09/2021 and discussed on 11/16/21, thought to be fibromyalgia. Pain is constant, no limitation of movement. Austin Fuller is sleeping 7-8 hours per night.  Denies swelling or redness of his joints.   Patient also complains of an itching sensation in between his shoulder blades with no rash. It feels like it is deep and cannot be relieve by scratching.  Problem has been going on for some time.  Hypertension:  Medications: Not currently on pharmacologic treatment.  BP readings at home: not checking his BP at home.  Negative for unusual or severe headache, visual changes, exertional chest pain, dyspnea,  focal weakness, or edema.  Lab Results  Component Value Date   CREATININE 0.87 08/17/2023   BUN 9 08/17/2023    NA 137 08/17/2023   K 4.0 08/17/2023   CL 102 08/17/2023   CO2 29 08/17/2023   Hyperlipidemia: Currently on rosuvastatin  10 mg daily.   Lab Results  Component Value Date   CHOL 110 08/17/2023   HDL 36.90 (L) 08/17/2023   LDLCALC 41 08/17/2023   LDLDIRECT 104.0 10/10/2022   TRIG 160.0 (H) 08/17/2023   CHOLHDL 3 08/17/2023   Elevated alkaline phosphatase: Mild. Negative for abdominal pain,N/V, jaundice, or changes in bowel habits. Lab Results  Component Value Date   ALT 15 08/17/2023   AST 12 08/17/2023   ALKPHOS 124 (H) 08/17/2023   BILITOT 0.3 08/17/2023   Review of Systems  Constitutional:  Negative for activity change, appetite change, chills and fever.  HENT:  Negative for mouth sores and sore throat.   Respiratory:  Negative for cough and wheezing.   Endocrine: Negative for cold intolerance and heat intolerance.  Genitourinary:  Negative for decreased urine volume, dysuria and hematuria.  Neurological:  Negative for syncope and facial asymmetry.  See other pertinent positives and negatives in HPI.  Current Outpatient Medications on File Prior to Visit  Medication Sig Dispense Refill   B-D INS SYR ULTRAFINE 1CC/30G 30G X 1/2" 1 ML MISC Inject 1 Device into the skin daily. 100 each 3   cholecalciferol (VITAMIN D3) 25 MCG (1000 UNIT) tablet Take 4,000 Units by mouth daily.     Continuous Glucose Receiver (FREESTYLE LIBRE 14  DAY READER) DEVI Use as directed to test blood sugar 1 each 0   Continuous Glucose Sensor (FREESTYLE LIBRE 14 DAY SENSOR) MISC USE TO TEST BLOOD SUGAR AS DIRECTED 1 each 3   insulin  glargine (LANTUS ) 100 unit/mL SOPN Inject 30 Units into the skin daily. 15 mL 2   metFORMIN  (GLUCOPHAGE ) 500 MG tablet 1 tab with breakfast and 2 tabs with supper. 180 tablet 1   ondansetron  (ZOFRAN ) 4 MG tablet Take 1 tablet (4 mg total) by mouth every 6 (six) hours as needed for nausea or vomiting. 40 tablet 1   rosuvastatin  (CRESTOR ) 10 MG tablet Take 1 tablet (10 mg  total) by mouth daily. Due for follow up. 90 tablet 3   No current facility-administered medications on file prior to visit.    Past Medical History:  Diagnosis Date   Cataract    Diabetes mellitus without complication (HCC)    Hyperlipidemia    Hypertension    No Known Allergies  Social History   Socioeconomic History   Marital status: Single    Spouse name: Not on file   Number of children: Not on file   Years of education: Not on file   Highest education level: Associate degree: occupational, Scientist, product/process development, or vocational program  Occupational History   Not on file  Tobacco Use   Smoking status: Never   Smokeless tobacco: Never  Vaping Use   Vaping status: Never Used  Substance and Sexual Activity   Alcohol use: Not Currently   Drug use: Never   Sexual activity: Not Currently  Other Topics Concern   Not on file  Social History Narrative   Not on file   Social Drivers of Health   Financial Resource Strain: Medium Risk (02/01/2024)   Overall Financial Resource Strain (CARDIA)    Difficulty of Paying Living Expenses: Somewhat hard  Food Insecurity: Food Insecurity Present (02/01/2024)   Hunger Vital Sign    Worried About Running Out of Food in the Last Year: Sometimes true    Ran Out of Food in the Last Year: Never true  Transportation Needs: No Transportation Needs (02/01/2024)   PRAPARE - Administrator, Civil Service (Medical): No    Lack of Transportation (Non-Medical): No  Physical Activity: Unknown (02/01/2024)   Exercise Vital Sign    Days of Exercise per Week: 0 days    Minutes of Exercise per Session: Not on file  Stress: No Stress Concern Present (02/01/2024)   [employer] of Occupational Health - Occupational Stress Questionnaire    Feeling of Stress : Only a little  Social Connections: Socially Isolated (02/01/2024)   Social Connection and Isolation Panel [NHANES]    Frequency of Communication with Friends and Family: Once a week     Frequency of Social Gatherings with Friends and Family: Never    Attends Religious Services: Never    Database administrator or Organizations: No    Attends Engineer, structural: Not on file    Marital Status: Never married   Vitals:   02/05/24 1504  BP: 122/86  Pulse: 64  Resp: 16  Temp: 98.6 F (37 C)  SpO2: 98%   Body mass index is 30.63 kg/m.  Physical Exam Vitals and nursing note reviewed.  Constitutional:      General: Austin Fuller is not in acute distress.    Appearance: Austin Fuller is well-developed.  HENT:     Head: Normocephalic and atraumatic.     Mouth/Throat:  Mouth: Mucous membranes are moist.     Pharynx: Oropharynx is clear. Uvula midline.  Eyes:     Conjunctiva/sclera: Conjunctivae normal.  Cardiovascular:     Rate and Rhythm: Normal rate and regular rhythm.     Pulses:          Posterior tibial pulses are 2+ on the right side and 2+ on the left side.     Heart sounds: No murmur heard. Pulmonary:     Effort: Pulmonary effort is normal. No respiratory distress.     Breath sounds: Normal breath sounds.  Abdominal:     Palpations: Abdomen is soft. There is no hepatomegaly or mass.     Tenderness: There is no abdominal tenderness.  Musculoskeletal:     Right lower leg: No edema.     Left lower leg: No edema.  Lymphadenopathy:     Cervical: No cervical adenopathy.  Skin:    General: Skin is warm.     Findings: No erythema or rash.  Neurological:     Mental Status: Austin Fuller is alert and oriented to person, place, and time.     Cranial Nerves: No cranial nerve deficit.     Gait: Gait normal.  Psychiatric:        Mood and Affect: Mood and affect normal.   ASSESSMENT AND PLAN:  Austin Fuller was seen today for chronic disease management.   Orders Placed This Encounter  Procedures   Basic metabolic panel with GFR   Hepatic function panel   CBC   ANA   C-reactive protein   Sedimentation rate   Rheumatoid factor   Cyclic citrul peptide antibody, IgG    POC HgB A1c   EKG 12-Lead   Lab Results  Component Value Date   ESRSEDRATE 8 02/05/2024   Lab Results  Component Value Date   CRP <1.0 02/05/2024   Lab Results  Component Value Date   NA 138 02/05/2024   CL 103 02/05/2024   K 3.8 02/05/2024   CO2 30 02/05/2024   BUN 7 02/05/2024   CREATININE 0.74 02/05/2024   GFR 107.66 02/05/2024   CALCIUM  9.2 02/05/2024   ALBUMIN 3.9 02/05/2024   GLUCOSE 286 (H) 02/05/2024   Lab Results  Component Value Date   ALT 10 02/05/2024   AST 13 02/05/2024   ALKPHOS 89 02/05/2024   BILITOT 0.5 02/05/2024   Lab Results  Component Value Date   HGBA1C 13.4 (A) 02/05/2024   Lab Results  Component Value Date   WBC 5.9 02/05/2024   HGB 14.1 02/05/2024   HCT 41.6 02/05/2024   MCV 91.7 02/05/2024   PLT 240.0 02/05/2024   Type 2 diabetes mellitus with other specified complication, with long-term current use of insulin  (HCC) Assessment & Plan: HgA1C is not at goal, it went from 15.2 to 13.4. GLP1 agonist not covered by his health insurance. Jardiance  25 mg added today, we discussed some side effects. Instructed to start Jardiance  25 mg 1/2 tablet before breakfast for 2 weeks and then increase to 1 tablet. Lantus  was increased from 30 units to 35 units daily. Morning dose of metformin  increased from 500 mg to 1000 mg with breakfast, continue at 1000 mg with supper. Regular exercise and healthy diet with avoidance of added sugar food intake is an important part of treatment and recommended. Annual eye exam, periodic dental and foot care recommended. F/U in 3 months.  Orders: -     POCT glycosylated hemoglobin (Hb A1C) -  Empagliflozin ; Take 1 tablet (25 mg total) by mouth daily.  Dispense: 90 tablet; Refill: 1  Polyarthralgia Assessment & Plan: With associated myalgia. We discussed possible etiologies. OA, fibromyalgia, and statin side effects among some to consider. Rheumatology workup ordered today, further recommendation will be  given accordingly. Recommend holding on rosuvastatin  for 3 to 4 weeks, if problem does not improve, Austin Fuller can resume medication and start Duloxetine  30 mg daily. Follow-up in 3 months.  Orders: -     ANA; Future -     C-reactive protein; Future -     Sedimentation rate; Future -     Rheumatoid factor; Future -     Cyclic citrul peptide antibody, IgG; Future -     DULoxetine  HCl; Take 1 capsule (30 mg total) by mouth daily.  Dispense: 30 capsule; Refill: 1  Hypertension, essential, benign Assessment & Plan: DBP mildly elevated. For now recommend continuing nonpharmacologic treatment given the fact we added Jardiance  today for DM II management. Low-salt/DASH diet. Eye exam is current. Monitor BP regularly EKG done today: NSR, normal axis and interval, no LVH criteria.No other EKG done in the past. F/U in 3 months.  Orders: -     Basic metabolic panel with GFR; Future -     EKG 12-Lead  Elevated alkaline phosphatase level Mild. We will continue following. Further recommendations according to lab results.  -     Hepatic function panel; Future -     CBC; Future  Peripheral polyneuropathy Assessment & Plan: Reporting intermittent numbness and tingling of feet. We discussed the importance of improving glucose control. Continue appropriate foot care. Duloxetine  added today.  Orders: -     DULoxetine  HCl; Take 1 capsule (30 mg total) by mouth daily.  Dispense: 30 capsule; Refill: 1   Return in about 3 months (around 05/09/2024) for chronic problems.  I, Fritz Jewel Wierda, acting as a scribe for Lejend Dalby [place], MD., have documented all relevant documentation on the behalf of Clydia Nieves [place], MD, as directed by  Alan Riles [place], MD while in the presence of Cordarro Spinnato [place], MD.   I, Tashiana Lamarca [place], MD, have reviewed all documentation for this visit. The documentation on 02/05/24 for the exam, diagnosis, procedures, and orders are all accurate and complete.  Berlie Persky G. [place], MD  [facility]. [place] office.

## 2024-02-05 NOTE — Assessment & Plan Note (Addendum)
 HgA1C is not at goal, it went from 15.2 to 13.4. GLP1 agonist not covered by his health insurance. Jardiance  25 mg added today, we discussed some side effects. Instructed to start Jardiance  25 mg 1/2 tablet before breakfast for 2 weeks and then increase to 1 tablet. Lantus  was increased from 30 units to 35 units daily. Morning dose of metformin  increased from 500 mg to 1000 mg with breakfast, continue at 1000 mg with supper. Regular exercise and healthy diet with avoidance of added sugar food intake is an important part of treatment and recommended. Annual eye exam, periodic dental and foot care recommended. F/U in 3 months.

## 2024-02-05 NOTE — Patient Instructions (Addendum)
 A few things to remember from today's visit:  Type 2 diabetes mellitus with other specified complication, with long-term current use of insulin  (HCC) - Plan: POC HgB A1c, empagliflozin  (JARDIANCE ) 25 MG TABS tablet  Hypertension, essential, benign - Plan: Basic metabolic panel with GFR, EKG 12-Lead  Polyarthralgia - Plan: ANA, C-reactive protein, Sedimentation rate, Rheumatoid factor, Cyclic citrul peptide antibody, IgG, DULoxetine  (CYMBALTA ) 30 MG capsule  Elevated alkaline phosphatase level - Plan: Hepatic function panel, CBC  Peripheral polyneuropathy - Plan: DULoxetine  (CYMBALTA ) 30 MG capsule  Jardiance  25 mg added today for blood sugar, start 1/2 tab daily before breakfast and in 2 weeks increase to 1 tan. Increase morning metformin  from 500 mg to 1000 mg. Lantus  increased from 30 U to 35 U. Caution with low blood sugars. Decrease sweets and foods that contain sugar. Hold on Rosuvastatin  for 3-4 weeks and see if muscle and joint pain improves, if not resume medication and try Duloxetine  30 mg daily.  Duloxetine   If you need refills for medications you take chronically, please call your pharmacy. Do not use My Chart to request refills or for acute issues that need immediate attention. If you send a my chart message, it may take a few days to be addressed, specially if I am not in the office.  Please be sure medication list is accurate. If a new problem present, please set up appointment sooner than planned today.

## 2024-02-05 NOTE — Assessment & Plan Note (Signed)
 With associated myalgia. We discussed possible etiologies. OA, fibromyalgia, and statin side effects among some to consider. Rheumatology workup ordered today, further recommendation will be given accordingly. Recommend holding on rosuvastatin  for 3 to 4 weeks, if problem does not improve, he can resume medication and start Duloxetine  30 mg daily. Follow-up in 3 months.

## 2024-02-05 NOTE — Assessment & Plan Note (Addendum)
 DBP mildly elevated. For now recommend continuing nonpharmacologic treatment given the fact we added Jardiance  today for DM II management. Low-salt/DASH diet. Eye exam is current. Monitor BP regularly F/U in 3 months.

## 2024-02-06 LAB — BASIC METABOLIC PANEL WITH GFR
BUN: 7 mg/dL (ref 6–23)
CO2: 30 meq/L (ref 19–32)
Calcium: 9.2 mg/dL (ref 8.4–10.5)
Chloride: 103 meq/L (ref 96–112)
Creatinine, Ser: 0.74 mg/dL (ref 0.40–1.50)
GFR: 107.66 mL/min (ref >60.00)
Glucose, Bld: 286 mg/dL — ABNORMAL HIGH (ref 70–99)
Potassium: 3.8 meq/L (ref 3.5–5.1)
Sodium: 138 meq/L (ref 135–145)

## 2024-02-06 LAB — CBC
HCT: 41.6 % (ref 39.0–52.0)
Hemoglobin: 14.1 g/dL (ref 13.0–17.0)
MCHC: 34 g/dL (ref 30.0–36.0)
MCV: 91.7 fl (ref 78.0–100.0)
Platelets: 240 10*3/uL (ref 150.0–400.0)
RBC: 4.53 Mil/uL (ref 4.22–5.81)
RDW: 11.8 % (ref 11.5–15.5)
WBC: 5.9 10*3/uL (ref 4.0–10.5)

## 2024-02-06 LAB — HEPATIC FUNCTION PANEL
ALT: 10 U/L (ref 0–53)
AST: 13 U/L (ref 0–37)
Albumin: 3.9 g/dL (ref 3.5–5.2)
Alkaline Phosphatase: 89 U/L (ref 39–117)
Bilirubin, Direct: 0.1 mg/dL (ref 0.0–0.3)
Total Bilirubin: 0.5 mg/dL (ref 0.2–1.2)
Total Protein: 6.3 g/dL (ref 6.0–8.3)

## 2024-02-06 LAB — SEDIMENTATION RATE: Sed Rate: 8 mm/h (ref 0–15)

## 2024-02-06 LAB — C-REACTIVE PROTEIN: CRP: <1 mg/dL (ref 0.5–20.0)

## 2024-02-07 ENCOUNTER — Encounter: Payer: Self-pay | Admitting: Family Medicine

## 2024-02-07 NOTE — Assessment & Plan Note (Signed)
 Reporting intermittent numbness and tingling of feet. We discussed the importance of improving glucose control. Continue appropriate foot care. Duloxetine  added today.

## 2024-02-08 LAB — CYCLIC CITRUL PEPTIDE ANTIBODY, IGG: Cyclic Citrullin Peptide Ab: <16 U

## 2024-02-08 LAB — ANA: Anti Nuclear Antibody (ANA): NEGATIVE

## 2024-02-08 LAB — RHEUMATOID FACTOR: Rheumatoid fact SerPl-aCnc: <10 [IU]/mL (ref <14)

## 2024-05-01 ENCOUNTER — Other Ambulatory Visit: Payer: Self-pay | Admitting: Family Medicine

## 2024-05-01 DIAGNOSIS — E1169 Type 2 diabetes mellitus with other specified complication: Secondary | ICD-10-CM

## 2024-07-03 DIAGNOSIS — R002 Palpitations: Secondary | ICD-10-CM | POA: Diagnosis not present

## 2024-07-03 DIAGNOSIS — R079 Chest pain, unspecified: Secondary | ICD-10-CM | POA: Diagnosis not present

## 2024-07-03 DIAGNOSIS — Z7984 Long term (current) use of oral hypoglycemic drugs: Secondary | ICD-10-CM | POA: Diagnosis not present

## 2024-07-03 DIAGNOSIS — E7800 Pure hypercholesterolemia, unspecified: Secondary | ICD-10-CM | POA: Diagnosis not present

## 2024-07-03 DIAGNOSIS — Z91141 Patient's other noncompliance with medication regimen due to financial hardship: Secondary | ICD-10-CM | POA: Diagnosis not present

## 2024-07-03 DIAGNOSIS — F32A Depression, unspecified: Secondary | ICD-10-CM | POA: Diagnosis not present

## 2024-07-03 DIAGNOSIS — Z794 Long term (current) use of insulin: Secondary | ICD-10-CM | POA: Diagnosis not present

## 2024-07-03 DIAGNOSIS — E1165 Type 2 diabetes mellitus with hyperglycemia: Secondary | ICD-10-CM | POA: Diagnosis not present

## 2024-07-03 DIAGNOSIS — R030 Elevated blood-pressure reading, without diagnosis of hypertension: Secondary | ICD-10-CM | POA: Diagnosis not present

## 2024-07-03 DIAGNOSIS — Z79899 Other long term (current) drug therapy: Secondary | ICD-10-CM | POA: Diagnosis not present

## 2024-07-03 DIAGNOSIS — R42 Dizziness and giddiness: Secondary | ICD-10-CM | POA: Diagnosis not present

## 2024-07-03 DIAGNOSIS — R61 Generalized hyperhidrosis: Secondary | ICD-10-CM | POA: Diagnosis not present

## 2024-07-03 DIAGNOSIS — G8929 Other chronic pain: Secondary | ICD-10-CM | POA: Diagnosis not present

## 2024-07-03 DIAGNOSIS — I249 Acute ischemic heart disease, unspecified: Secondary | ICD-10-CM | POA: Diagnosis not present

## 2024-07-08 NOTE — Progress Notes (Unsigned)
 No chief complaint on file.   Austin Fuller is a 48 y.o. male, who is here today for chronic disease management. Last seen on *** Discussed the use of AI scribe software for clinical note transcription with the patient, who gave verbal consent to proceed.  History of Present Illness    Hypertension:  Medications:*** BP readings at home:*** Side effects:***  Negative for unusual or severe headache, visual changes, exertional chest pain, dyspnea,  focal weakness, or edema.  Lab Results  Component Value Date   CREATININE 0.74 02/05/2024   BUN 7 02/05/2024   NA 138 02/05/2024   K 3.8 02/05/2024   CL 103 02/05/2024   CO2 30 02/05/2024    Diabetes Mellitus II:  - Checking BG at home: *** - Medications: *** - Compliance: *** - Diet: *** - Exercise: *** - eye exam: *** - foot exam: ***  - Negative for symptoms of hypoglycemia, polyuria, polydipsia, numbness extremities, foot ulcers/trauma  Lab Results  Component Value Date   HGBA1C 13.4 (A) 02/05/2024   No results found for: MICROALBUR, MALB24HUR  Hyperlipidemia: Currently on *** Following a low fat diet: ***. Side effects from medication:*** Lab Results  Component Value Date   CHOL 110 08/17/2023   HDL 36.90 (L) 08/17/2023   LDLCALC 41 08/17/2023   LDLDIRECT 104.0 10/10/2022   TRIG 160.0 (H) 08/17/2023   CHOLHDL 3 08/17/2023    Review of Systems See other pertinent positives and negatives in HPI.  Current Outpatient Medications on File Prior to Visit  Medication Sig Dispense Refill   B-D INS SYR ULTRAFINE 1CC/30G 30G X 1/2 1 ML MISC Inject 1 Device into the skin daily. 100 each 3   cholecalciferol (VITAMIN D3) 25 MCG (1000 UNIT) tablet Take 4,000 Units by mouth daily.     Continuous Glucose Receiver (FREESTYLE LIBRE 14 DAY READER) DEVI Use as directed to test blood sugar 1 each 0   Continuous Glucose Sensor (FREESTYLE LIBRE 14 DAY SENSOR) MISC USE TO TEST BLOOD SUGAR AS DIRECTED 1  each 3   DULoxetine  (CYMBALTA ) 30 MG capsule Take 1 capsule (30 mg total) by mouth daily. 30 capsule 1   empagliflozin  (JARDIANCE ) 25 MG TABS tablet Take 1 tablet (25 mg total) by mouth daily. 90 tablet 1   insulin  glargine (LANTUS ) 100 unit/mL SOPN Inject 30 Units into the skin daily. 15 mL 2   metFORMIN  (GLUCOPHAGE ) 500 MG tablet take ONE tablet by MOUTH with breakfast AND TWO tablets with SUPPER 180 tablet 1   ondansetron  (ZOFRAN ) 4 MG tablet Take 1 tablet (4 mg total) by mouth every 6 (six) hours as needed for nausea or vomiting. 40 tablet 1   rosuvastatin  (CRESTOR ) 10 MG tablet Take 1 tablet (10 mg total) by mouth daily. Due for follow up. 90 tablet 3   No current facility-administered medications on file prior to visit.    Past Medical History:  Diagnosis Date   Cataract    Diabetes mellitus without complication (HCC)    Hyperlipidemia    Hypertension     No Known Allergies  Social History   Socioeconomic History   Marital status: Single    Spouse name: Not on file   Number of children: Not on file   Years of education: Not on file   Highest education level: Associate degree: occupational, Scientist, product/process development, or vocational program  Occupational History   Not on file  Tobacco Use   Smoking status: Never   Smokeless tobacco: Never  Vaping  Use   Vaping status: Never Used  Substance and Sexual Activity   Alcohol use: Not Currently   Drug use: Never   Sexual activity: Not Currently  Other Topics Concern   Not on file  Social History Narrative   Not on file   Social Drivers of Health   Financial Resource Strain: Medium Risk (07/07/2024)   Overall Financial Resource Strain (CARDIA)    Difficulty of Paying Living Expenses: Somewhat hard  Food Insecurity: No Food Insecurity (07/07/2024)   Hunger Vital Sign    Worried About Running Out of Food in the Last Year: Never true    Ran Out of Food in the Last Year: Never true  Transportation Needs: No Transportation Needs (07/07/2024)    PRAPARE - Administrator, Civil Service (Medical): No    Lack of Transportation (Non-Medical): No  Physical Activity: Inactive (07/07/2024)   Exercise Vital Sign    Days of Exercise per Week: 0 days    Minutes of Exercise per Session: Not on file  Stress: No Stress Concern Present (07/07/2024)   [employer] of Occupational Health - Occupational Stress Questionnaire    Feeling of Stress: Not at all  Social Connections: Socially Isolated (07/07/2024)   Social Connection and Isolation Panel    Frequency of Communication with Friends and Family: Never    Frequency of Social Gatherings with Friends and Family: Never    Attends Religious Services: Never    Database administrator or Organizations: No    Attends Engineer, structural: Not on file    Marital Status: Never married    There were no vitals filed for this visit.  There is no height or weight on file to calculate BMI.  Physical Exam  ASSESSMENT AND PLAN:  There are no diagnoses linked to this encounter.  No orders of the defined types were placed in this encounter.   No problem-specific Assessment & Plan notes found for this encounter.   No follow-ups on file.   Lulia Schriner [place], MD [facility]. [place] office.

## 2024-07-09 ENCOUNTER — Ambulatory Visit (INDEPENDENT_AMBULATORY_CARE_PROVIDER_SITE_OTHER): Admitting: Family Medicine

## 2024-07-09 ENCOUNTER — Encounter: Payer: Self-pay | Admitting: Family Medicine

## 2024-07-09 ENCOUNTER — Ambulatory Visit: Payer: Self-pay | Admitting: Family Medicine

## 2024-07-09 VITALS — BP 112/70 | HR 75 | Temp 98.1°F | Resp 16 | Ht 66.0 in | Wt 183.2 lb

## 2024-07-09 DIAGNOSIS — Z794 Long term (current) use of insulin: Secondary | ICD-10-CM | POA: Diagnosis not present

## 2024-07-09 DIAGNOSIS — R251 Tremor, unspecified: Secondary | ICD-10-CM | POA: Insufficient documentation

## 2024-07-09 DIAGNOSIS — E785 Hyperlipidemia, unspecified: Secondary | ICD-10-CM | POA: Diagnosis not present

## 2024-07-09 DIAGNOSIS — E1169 Type 2 diabetes mellitus with other specified complication: Secondary | ICD-10-CM | POA: Diagnosis not present

## 2024-07-09 DIAGNOSIS — G629 Polyneuropathy, unspecified: Secondary | ICD-10-CM

## 2024-07-09 LAB — CBC
HCT: 40.8 % (ref 39.0–52.0)
Hemoglobin: 14 g/dL (ref 13.0–17.0)
MCHC: 34.4 g/dL (ref 30.0–36.0)
MCV: 91.8 fl (ref 78.0–100.0)
Platelets: 205 K/uL (ref 150.0–400.0)
RBC: 4.44 Mil/uL (ref 4.22–5.81)
RDW: 12.9 % (ref 11.5–15.5)
WBC: 4.8 K/uL (ref 4.0–10.5)

## 2024-07-09 LAB — TSH: TSH: 1.71 u[IU]/mL (ref 0.35–5.50)

## 2024-07-09 LAB — POCT GLYCOSYLATED HEMOGLOBIN (HGB A1C): Hemoglobin A1C: 12.1 % — AB (ref 4.0–5.6)

## 2024-07-09 LAB — COMPREHENSIVE METABOLIC PANEL WITH GFR
ALT: 16 U/L (ref 0–53)
AST: 10 U/L (ref 0–37)
Albumin: 4.1 g/dL (ref 3.5–5.2)
Alkaline Phosphatase: 84 U/L (ref 39–117)
BUN: 12 mg/dL (ref 6–23)
CO2: 29 meq/L (ref 19–32)
Calcium: 9.4 mg/dL (ref 8.4–10.5)
Chloride: 103 meq/L (ref 96–112)
Creatinine, Ser: 0.79 mg/dL (ref 0.40–1.50)
GFR: 105.24 mL/min (ref >60.00)
Glucose, Bld: 305 mg/dL — ABNORMAL HIGH (ref 70–99)
Potassium: 4 meq/L (ref 3.5–5.1)
Sodium: 140 meq/L (ref 135–145)
Total Bilirubin: 0.4 mg/dL (ref 0.2–1.2)
Total Protein: 6 g/dL (ref 6.0–8.3)

## 2024-07-09 LAB — LIPID PANEL
Cholesterol: 95 mg/dL (ref 0–200)
HDL: 47.7 mg/dL (ref >39.00)
LDL Cholesterol: 28 mg/dL (ref 0–99)
NonHDL: 47.23
Total CHOL/HDL Ratio: 2
Triglycerides: 95 mg/dL (ref 0.0–149.0)
VLDL: 19 mg/dL (ref 0.0–40.0)

## 2024-07-09 LAB — VITAMIN B12: Vitamin B-12: 246 pg/mL (ref 211–911)

## 2024-07-09 LAB — MICROALBUMIN / CREATININE URINE RATIO
Creatinine,U: 110.6 mg/dL
Microalb Creat Ratio: 16 mg/g (ref 0.0–30.0)
Microalb, Ur: 1.8 mg/dL (ref 0.0–1.9)

## 2024-07-09 MED ORDER — ROSUVASTATIN CALCIUM 10 MG PO TABS: 10.0000 mg | ORAL_TABLET | Freq: Every day | ORAL | 3 refills | Status: AC

## 2024-07-09 MED ORDER — DULOXETINE HCL 30 MG PO CPEP: 30.0000 mg | ORAL_CAPSULE | Freq: Every day | ORAL | 1 refills | Status: DC

## 2024-07-09 NOTE — Assessment & Plan Note (Addendum)
 He is no longer on duloxetine , which seemed to help. Duloxetine  30 mg daily to be resumed, he can increase dose to 60 mg daily in 4 weeks if needed. Continue appropriate footcare. F/U in 4 months.

## 2024-07-09 NOTE — Assessment & Plan Note (Signed)
 Mild. We discussed possible etiologies, it seems essential tremor. For now he prefers to hold on neuro referral. Continue monitoring for changes.

## 2024-07-09 NOTE — Assessment & Plan Note (Signed)
 Problem has been not been adequately controlled, he has not been consistent with taking medications.  He is no longer taking Jardiance . GLP-1 not covered by insurance. Currently he is on basal insulin , he is not sure about which one he is taking, 10 units twice daily and also on metformin  500 mg 2 tablets twice daily with food. Recommend increasing dose of basal insulin  from 10 units to 15 units twice daily. Regular exercise and healthy diet with avoidance of added sugar food intake is an important part of treatment and recommended. Annual eye exam, periodic dental and foot care to continue. F/U in 4 months.

## 2024-07-09 NOTE — Patient Instructions (Addendum)
 A few things to remember from today's visit:  Type 2 diabetes mellitus with other specified complication, with long-term current use of insulin  (HCC) - Plan: POC HgB A1c, Microalbumin / creatinine urine ratio, Comprehensive metabolic panel with GFR, Vitamin B12, TSH  Hyperlipidemia associated with type 2 diabetes mellitus (HCC) - Plan: rosuvastatin  (CRESTOR ) 10 MG tablet, Comprehensive metabolic panel with GFR, Lipid panel  Polyarthralgia - Plan: DULoxetine  (CYMBALTA ) 30 MG capsule  Peripheral polyneuropathy - Plan: DULoxetine  (CYMBALTA ) 30 MG capsule, Vitamin B12, TSH  Tremor - Plan: CBC, Comprehensive metabolic panel with GFR  Increase insulin  from 10 U to 15 U 2 times daily. Resume duloxetine  30 mg daily. In 4-6 weeks can increase to 60 mg if needed, lat me know. Monitor blood sugar. No change sin rest.  If you need refills for medications you take chronically, please call your pharmacy. Do not use My Chart to request refills or for acute issues that need immediate attention. If you send a my chart message, it may take a few days to be addressed, specially if I am not in the office.  Please be sure medication list is accurate. If a new problem present, please set up appointment sooner than planned today.

## 2024-07-09 NOTE — Assessment & Plan Note (Addendum)
 Last LDL 41 in 08/2023. Currently on rosuvastatin  10 mg daily, which he has been taking consistently for 1 to 2 months. Low fat diet also recommended. Further recommendation will be given according to lipid panel result.

## 2024-09-08 ENCOUNTER — Ambulatory Visit: Admitting: Family Medicine

## 2024-11-10 ENCOUNTER — Ambulatory Visit: Admitting: Family Medicine

## 2024-11-11 NOTE — Progress Notes (Unsigned)
 "  No chief complaint on file.   Austin Fuller is a 49 y.o. male with a PMHx significant for HTN, DM II, and HLD, who is here today for chronic disease management.  Last seen on 07/09/24 .  Diabetes Mellitus II: Dx'ed in 2017  Currently on Metformin  500 mg with breakfast and 1000 mg with dinner, and on Lantus  30 units daily.  ***  He endorses occasional numbness and tingling in his feet.  - Negative for symptoms of hypoglycemia, polyuria, polydipsia, foot ulcers/trauma + Peripheral neuropathy *** Lab Results  Component Value Date   HGBA1C 12.1 (A) 07/09/2024   Lab Results  Component Value Date   MICROALBUR 1.8 07/09/2024   Fibromyalgia:*** Problem started around 09/2021 and discussed on 11/16/21. Pain is constant, no limitation of movement. He is sleeping 7-8 hours per night.  Denies swelling or redness of his joints. ***  Hypertension: Not currently on pharmacologic treatment.  ***  Negative for unusual or severe headache, visual changes, exertional chest pain, dyspnea,  focal weakness, or edema.  Lab Results  Component Value Date   CREATININE 0.79 07/09/2024   BUN 12 07/09/2024   NA 140 07/09/2024   K 4.0 07/09/2024   CL 103 07/09/2024   CO2 29 07/09/2024    Review of Systems  Constitutional:  Positive for fatigue. Negative for activity change, appetite change, chills and fever.  HENT:  Negative for mouth sores and sore throat.   Respiratory:  Negative for cough and wheezing.   Endocrine: Negative for cold intolerance and heat intolerance.  Genitourinary:  Negative for decreased urine volume, dysuria and hematuria.  Neurological:  Negative for syncope and facial asymmetry.  See other pertinent positives and negatives in HPI.  Current Outpatient Medications on File Prior to Visit  Medication Sig Dispense Refill   B-D INS SYR ULTRAFINE 1CC/30G 30G X 1/2 1 ML MISC Inject 1 Device into the skin daily. 100 each 3   cholecalciferol (VITAMIN D3) 25 MCG  (1000 UNIT) tablet Take 4,000 Units by mouth daily.     Continuous Glucose Receiver (FREESTYLE LIBRE 14 DAY READER) DEVI Use as directed to test blood sugar 1 each 0   Continuous Glucose Sensor (FREESTYLE LIBRE 14 DAY SENSOR) MISC USE TO TEST BLOOD SUGAR AS DIRECTED 1 each 3   DULoxetine  (CYMBALTA ) 30 MG capsule Take 1 capsule (30 mg total) by mouth daily. 30 capsule 1   insulin  glargine (LANTUS ) 100 unit/mL SOPN Inject 30 Units into the skin daily. 15 mL 2   metFORMIN  (GLUCOPHAGE ) 500 MG tablet take ONE tablet by MOUTH with breakfast AND TWO tablets with SUPPER 180 tablet 1   ondansetron  (ZOFRAN ) 4 MG tablet Take 1 tablet (4 mg total) by mouth every 6 (six) hours as needed for nausea or vomiting. 40 tablet 1   rosuvastatin  (CRESTOR ) 10 MG tablet Take 1 tablet (10 mg total) by mouth daily. Due for follow up. 90 tablet 3   No current facility-administered medications on file prior to visit.    Past Medical History:  Diagnosis Date   Cataract    Diabetes mellitus without complication (HCC)    Hyperlipidemia    Hypertension    No Known Allergies  Social History   Socioeconomic History   Marital status: Single    Spouse name: Not on file   Number of children: Not on file   Years of education: Not on file   Highest education level: Associate degree: occupational, scientist, product/process development, or vocational program  Occupational History  Not on file  Tobacco Use   Smoking status: Never   Smokeless tobacco: Never  Vaping Use   Vaping status: Never Used  Substance and Sexual Activity   Alcohol use: Not Currently   Drug use: Never   Sexual activity: Not Currently  Other Topics Concern   Not on file  Social History Narrative   Not on file   Social Drivers of Health   Tobacco Use: Low Risk (07/09/2024)   Patient History    Smoking Tobacco Use: Never    Smokeless Tobacco Use: Never    Passive Exposure: Not on file  Financial Resource Strain: Low Risk (11/08/2024)   Overall Financial Resource  Strain (CARDIA)    Difficulty of Paying Living Expenses: Not very hard  Food Insecurity: No Food Insecurity (11/08/2024)   Epic    Worried About Radiation Protection Practitioner of Food in the Last Year: Never true    Ran Out of Food in the Last Year: Never true  Transportation Needs: No Transportation Needs (11/08/2024)   Epic    Lack of Transportation (Medical): No    Lack of Transportation (Non-Medical): No  Physical Activity: Inactive (11/08/2024)   Exercise Vital Sign    Days of Exercise per Week: 0 days    Minutes of Exercise per Session: Not on file  Stress: No Stress Concern Present (11/08/2024)   [employer] of Occupational Health - Occupational Stress Questionnaire    Feeling of Stress: Only a little  Social Connections: Socially Isolated (11/08/2024)   Social Connection and Isolation Panel    Frequency of Communication with Friends and Family: Once a week    Frequency of Social Gatherings with Friends and Family: Never    Attends Religious Services: Never    Database Administrator or Organizations: No    Attends Engineer, Structural: Not on file    Marital Status: Never married  Depression (PHQ2-9): Low Risk (02/07/2024)   Depression (PHQ2-9)    PHQ-2 Score: 0  Alcohol Screen: Low Risk (11/08/2024)   Alcohol Screen    Last Alcohol Screening Score (AUDIT): 1  Housing: Unknown (11/08/2024)   Epic    Unable to Pay for Housing in the Last Year: No    Number of Times Moved in the Last Year: Not on file    Homeless in the Last Year: No  Utilities: Not on file  Health Literacy: Not on file   There were no vitals filed for this visit.  There is no height or weight on file to calculate BMI.  Physical Exam Vitals and nursing note reviewed.  Constitutional:      General: He is not in acute distress.    Appearance: He is well-developed.  HENT:     Head: Normocephalic and atraumatic.     Mouth/Throat:     Mouth: Mucous membranes are moist.     Pharynx: Oropharynx is clear. Uvula  midline.  Eyes:     Conjunctiva/sclera: Conjunctivae normal.  Cardiovascular:     Rate and Rhythm: Normal rate and regular rhythm.     Pulses:          Posterior tibial pulses are 2+ on the right side and 2+ on the left side.     Heart sounds: No murmur heard. Pulmonary:     Effort: Pulmonary effort is normal. No respiratory distress.     Breath sounds: Normal breath sounds.  Abdominal:     Palpations: Abdomen is soft. There is no hepatomegaly or mass.  Tenderness: There is no abdominal tenderness.  Musculoskeletal:     Right lower leg: No edema.     Left lower leg: No edema.  Lymphadenopathy:     Cervical: No cervical adenopathy.  Skin:    General: Skin is warm.     Findings: No erythema or rash.  Neurological:     Mental Status: He is alert and oriented to person, place, and time.     Cranial Nerves: No cranial nerve deficit.     Gait: Gait normal.  Psychiatric:        Mood and Affect: Mood and affect normal.   ASSESSMENT AND PLAN:  Austin Fuller was seen today for chronic disease management.   No orders of the defined types were placed in this encounter.   No follow-ups on file.   Adit Riddles G. Austin Frayre, MD  [facility]. [place] office.  "

## 2024-11-12 ENCOUNTER — Telehealth: Payer: Self-pay

## 2024-11-12 ENCOUNTER — Encounter: Payer: Self-pay | Admitting: Family Medicine

## 2024-11-12 ENCOUNTER — Ambulatory Visit (INDEPENDENT_AMBULATORY_CARE_PROVIDER_SITE_OTHER): Admitting: Family Medicine

## 2024-11-12 VITALS — BP 138/88 | HR 89 | Temp 98.4°F | Resp 16 | Ht 66.0 in | Wt 180.2 lb

## 2024-11-12 DIAGNOSIS — Z794 Long term (current) use of insulin: Secondary | ICD-10-CM | POA: Diagnosis not present

## 2024-11-12 DIAGNOSIS — G629 Polyneuropathy, unspecified: Secondary | ICD-10-CM | POA: Diagnosis not present

## 2024-11-12 DIAGNOSIS — E1165 Type 2 diabetes mellitus with hyperglycemia: Secondary | ICD-10-CM | POA: Diagnosis not present

## 2024-11-12 DIAGNOSIS — I10 Essential (primary) hypertension: Secondary | ICD-10-CM

## 2024-11-12 DIAGNOSIS — M797 Fibromyalgia: Secondary | ICD-10-CM | POA: Diagnosis not present

## 2024-11-12 LAB — POCT GLYCOSYLATED HEMOGLOBIN (HGB A1C): Hemoglobin A1C: 10.5 % — AB (ref 4.0–5.6)

## 2024-11-12 MED ORDER — OZEMPIC (0.25 OR 0.5 MG/DOSE) 2 MG/3ML ~~LOC~~ SOPN: 0.2500 mg | PEN_INJECTOR | SUBCUTANEOUS | 0 refills | Status: AC

## 2024-11-12 MED ORDER — OZEMPIC (0.25 OR 0.5 MG/DOSE) 2 MG/3ML ~~LOC~~ SOPN: PEN_INJECTOR | SUBCUTANEOUS | 1 refills | Status: DC

## 2024-11-12 MED ORDER — DULOXETINE HCL 60 MG PO CPEP: 60.0000 mg | ORAL_CAPSULE | Freq: Every day | ORAL | 2 refills | Status: AC

## 2024-11-12 MED ORDER — METFORMIN HCL 500 MG PO TABS: ORAL_TABLET | ORAL | 2 refills | Status: AC

## 2024-11-12 MED ORDER — SEMAGLUTIDE (1 MG/DOSE) 4 MG/3ML ~~LOC~~ SOPN: 1.0000 mg | PEN_INJECTOR | SUBCUTANEOUS | 1 refills | Status: AC

## 2024-11-12 MED ORDER — OZEMPIC (0.25 OR 0.5 MG/DOSE) 2 MG/3ML ~~LOC~~ SOPN: 0.5000 mg | PEN_INJECTOR | SUBCUTANEOUS | 0 refills | Status: AC

## 2024-11-12 NOTE — Assessment & Plan Note (Addendum)
 Problem is not well-controlled. Complicated with neuropathy. A1c today 10.5 (12.1). Continue metformin  500 mg a.m. and at 1000 mg p.m. and Lantus  30 units daily. Today we added Ozempic , starting with 0.25 mg weekly for 4 weeks and titrating up every 4 weeks as tolerated to 1 mg. We discussed some dietary recommendations, encouraged to try to decrease sugar intake.  Continue daily walks, please keep. Monitor BS, if persistently elevated in 2 months, we could adjust Lantus . We discussed possible complications of elevated glucose. Annual eye exam, periodic dental and foot care to continue. F/U in 4 months.

## 2024-11-12 NOTE — Telephone Encounter (Signed)
 Copied from CRM [id number]. Topic: Clinical - Prescription Issue >> Nov 12, 2024 10:23 AM Austin Fuller wrote: Reason for CRM: Austin Fuller w/[facility] [phone]  Pls call them back.. they need clarity on the directions for Inject 0.25 mg into the skin once a week for 4 doses.. Can you update the directions?  Should say 8 dose?  Pls review Semaglutide  0.5 MG/DOSE, Inject 0.5 mg into the skin once a week for 28 days. Can you update the directions?  should be either 56 days or change to Pls review both issues and resent corrected instruction

## 2024-11-12 NOTE — Assessment & Plan Note (Signed)
 Improved with duloxetine  30 mg daily, recommend increasing dose to 60 mg daily. Regular physical activity encouraged. Continue adequate sleep hygiene.

## 2024-11-12 NOTE — Patient Instructions (Addendum)
 A few things to remember from today's visit:  Type 2 diabetes mellitus with other specified complication, with long-term current use of insulin  (HCC) - Plan: POC HgB A1c, Semaglutide ,0.25 or 0.5MG /DOS, (OZEMPIC , 0.25 OR 0.5 MG/DOSE,) 2 MG/3ML SOPN  Fibromyalgia - Plan: DULoxetine  (CYMBALTA ) 60 MG capsule  Peripheral polyneuropathy - Plan: DULoxetine  (CYMBALTA ) 60 MG capsule  Hypertension, essential, benign  Ozempic  added today. Duloxetine  increased to 60 mg. Rest unchanged.  If you need refills for medications you take chronically, please call your pharmacy. Do not use My Chart to request refills or for acute issues that need immediate attention. If you send a my chart message, it may take a few days to be addressed, specially if I am not in the office.  Please be sure medication list is accurate. If a new problem present, please set up appointment sooner than planned today.

## 2024-11-12 NOTE — Telephone Encounter (Signed)
 I sent 3 prescriptions of Semaglutide :  #1 0.25 mg weekly subcutaneous x 4 doses (28 days), #2 semaglutide  0.5 mg subcutaneous weekly for 4 weeks (28 days), and #3 semaglutide  1 mg subcutaneous weekly to continue. Thanks, BJ

## 2024-11-12 NOTE — Assessment & Plan Note (Signed)
 Problem has improved but still not adequately controlled. Duloxetine  dose increased from 30 mg daily to 60 mg daily. Continue appropriate footcare. We could consider adding gabapentin  at bedtime.

## 2024-11-12 NOTE — Assessment & Plan Note (Addendum)
 BP mildly elevated today. He is not monitoring BP at home, instructed to do so. Goal < 130/80. For now continue nonpharmacologic treatment. Eye exam is due. Follow-up in 4 months.

## 2024-11-12 NOTE — Telephone Encounter (Signed)
 Spoke with the pharmacist and we worked it out and gave her verbal.

## 2025-03-10 ENCOUNTER — Ambulatory Visit: Admitting: Family Medicine
